# Patient Record
Sex: Female | Born: 1975 | Race: White | Hispanic: No | Marital: Single | Smoking: Former smoker
Health system: Southern US, Community
[De-identification: ages and names within clinical notes are randomized; demographics above are authoritative.]

## PROBLEM LIST (undated history)

## (undated) DIAGNOSIS — F17210 Nicotine dependence, cigarettes, uncomplicated: Secondary | ICD-10-CM

## (undated) DIAGNOSIS — A4902 Methicillin resistant Staphylococcus aureus infection, unspecified site: Secondary | ICD-10-CM

## (undated) DIAGNOSIS — F419 Anxiety disorder, unspecified: Secondary | ICD-10-CM

## (undated) DIAGNOSIS — R9431 Abnormal electrocardiogram [ECG] [EKG]: Secondary | ICD-10-CM

## (undated) DIAGNOSIS — R569 Unspecified convulsions: Secondary | ICD-10-CM

## (undated) DIAGNOSIS — E7800 Pure hypercholesterolemia, unspecified: Secondary | ICD-10-CM

## (undated) DIAGNOSIS — I459 Conduction disorder, unspecified: Secondary | ICD-10-CM

## (undated) DIAGNOSIS — I2510 Atherosclerotic heart disease of native coronary artery without angina pectoris: Secondary | ICD-10-CM

## (undated) DIAGNOSIS — J449 Chronic obstructive pulmonary disease, unspecified: Secondary | ICD-10-CM

## (undated) HISTORY — DX: Atherosclerotic heart disease of native coronary artery without angina pectoris: I25.10

## (undated) HISTORY — DX: Nicotine dependence, cigarettes, uncomplicated: F17.210

## (undated) HISTORY — PX: ACNE CYST REMOVAL: SUR1112

## (undated) HISTORY — DX: Conduction disorder, unspecified: I45.9

## (undated) HISTORY — DX: Abnormal electrocardiogram (ECG) (EKG): R94.31

## (undated) HISTORY — DX: Anxiety disorder, unspecified: F41.9

## (undated) HISTORY — DX: Pure hypercholesterolemia, unspecified: E78.00

---

## 2000-07-15 ENCOUNTER — Ambulatory Visit (HOSPITAL_BASED_OUTPATIENT_CLINIC_OR_DEPARTMENT_OTHER): Admission: RE | Admit: 2000-07-15 | Discharge: 2000-07-15 | Payer: Self-pay | Admitting: Orthopedic Surgery

## 2008-11-27 ENCOUNTER — Emergency Department (HOSPITAL_COMMUNITY): Admission: EM | Admit: 2008-11-27 | Discharge: 2008-11-27 | Payer: Self-pay | Admitting: Emergency Medicine

## 2008-12-03 ENCOUNTER — Emergency Department (HOSPITAL_COMMUNITY): Admission: EM | Admit: 2008-12-03 | Discharge: 2008-12-03 | Payer: Self-pay | Admitting: Emergency Medicine

## 2009-03-02 ENCOUNTER — Encounter: Payer: Self-pay | Admitting: Gastroenterology

## 2009-03-15 ENCOUNTER — Emergency Department (HOSPITAL_COMMUNITY): Admission: EM | Admit: 2009-03-15 | Discharge: 2009-03-15 | Payer: Self-pay | Admitting: Emergency Medicine

## 2009-11-28 ENCOUNTER — Emergency Department (HOSPITAL_COMMUNITY): Admission: EM | Admit: 2009-11-28 | Discharge: 2009-11-28 | Payer: Self-pay | Admitting: Emergency Medicine

## 2009-12-24 ENCOUNTER — Emergency Department (HOSPITAL_COMMUNITY): Admission: EM | Admit: 2009-12-24 | Discharge: 2009-12-24 | Payer: Self-pay | Admitting: Emergency Medicine

## 2010-05-01 ENCOUNTER — Emergency Department (HOSPITAL_COMMUNITY): Admission: EM | Admit: 2010-05-01 | Discharge: 2010-05-01 | Payer: Self-pay | Admitting: Emergency Medicine

## 2010-07-21 ENCOUNTER — Emergency Department (HOSPITAL_COMMUNITY): Admission: EM | Admit: 2010-07-21 | Discharge: 2010-07-22 | Payer: Self-pay | Admitting: Emergency Medicine

## 2011-01-02 LAB — CBC
MCH: 32.2 pg (ref 26.0–34.0)
MCV: 94.5 fL (ref 78.0–100.0)
RBC: 3.94 MIL/uL (ref 3.87–5.11)
RDW: 12.8 % (ref 11.5–15.5)

## 2011-01-02 LAB — POCT I-STAT, CHEM 8
Chloride: 108 mEq/L (ref 96–112)
Glucose, Bld: 100 mg/dL — ABNORMAL HIGH (ref 70–99)
HCT: 40 % (ref 36.0–46.0)
Potassium: 4.5 mEq/L (ref 3.5–5.1)

## 2011-01-02 LAB — DIFFERENTIAL
Basophils Relative: 1 % (ref 0–1)
Eosinophils Absolute: 0.1 10*3/uL (ref 0.0–0.7)
Neutrophils Relative %: 50 % (ref 43–77)

## 2011-01-02 LAB — URINALYSIS, ROUTINE W REFLEX MICROSCOPIC
Glucose, UA: NEGATIVE mg/dL
Ketones, ur: NEGATIVE mg/dL
Protein, ur: NEGATIVE mg/dL
Specific Gravity, Urine: 1.01 (ref 1.005–1.030)
pH: 6 (ref 5.0–8.0)

## 2011-01-02 LAB — URINE MICROSCOPIC-ADD ON

## 2011-01-05 LAB — CBC
Hemoglobin: 14 g/dL (ref 12.0–15.0)
MCH: 32.1 pg (ref 26.0–34.0)
MCHC: 34.7 g/dL (ref 30.0–36.0)
MCV: 92.6 fL (ref 78.0–100.0)
Platelets: 218 10*3/uL (ref 150–400)
RDW: 13.3 % (ref 11.5–15.5)
WBC: 9.5 10*3/uL (ref 4.0–10.5)

## 2011-01-05 LAB — COMPREHENSIVE METABOLIC PANEL
AST: 23 U/L (ref 0–37)
Albumin: 4.1 g/dL (ref 3.5–5.2)
BUN: 10 mg/dL (ref 6–23)
Calcium: 9.1 mg/dL (ref 8.4–10.5)
Creatinine, Ser: 1.08 mg/dL (ref 0.4–1.2)
GFR calc non Af Amer: 57 mL/min — ABNORMAL LOW (ref >60)

## 2011-01-05 LAB — DIFFERENTIAL
Basophils Absolute: 0 10*3/uL (ref 0.0–0.1)
Eosinophils Relative: 1 % (ref 0–5)
Lymphocytes Relative: 15 % (ref 12–46)
Lymphs Abs: 1.4 10*3/uL (ref 0.7–4.0)
Monocytes Absolute: 0.7 10*3/uL (ref 0.1–1.0)
Monocytes Relative: 8 % (ref 3–12)

## 2011-03-07 NOTE — Op Note (Signed)
[facility]  Patient:    Kendra Moreno                         MRN: [id number] Adm. Date:  [id number] Attending:  Marlowe Shores                           Operative Report  PREOPERATIVE DIAGNOSIS:  Ganglion cyst dorsally, right wrist.  POSTOPERATIVE DIAGNOSIS:  Ganglion cyst dorsally, right wrist.  PROCEDURE:  Excision of dorsal ganglion cyst, right wrist.  SURGEON:  Artist Pais. Mina Marble, M.D.  ASSISTANT:  Junius Roads. Ireton, P.A.C.  ANESTHESIA:  General.  TOURNIQUET TIME:  30 minutes.  COMPLICATIONS:  None.  DRAINS:  None.  SPECIMENS:  One specimen sent.  DESCRIPTION OF PROCEDURE:  The patient was taken to the operating room and after induction of adequate general anesthesia, the right upper extremity was prepped and draped in the usual sterile fashion.  An Esmarch was used to exsanguinate the limb, and the tourniquet was inflated to 350 mmHg.  At this time, a transverse incision 3.5-4 cm in length was made over a large mass over the second and third dorsal compartments in the right wrist.  The incision was taken down through the skin and subcutaneous tissues with care to carefully identify and retract the large dorsal veins and a branch of the radial sensory nerve.  At this point in time, the cyst was encountered.  It seemed to be coming between the second and third dorsal compartments.  It was carefully dissected free of the underlying soft tissues and traced down to its stalk overlying the dorsal capsule, and a small amount of capsule as well as the entire cyst and stalk were removed in its entirety.  The wound was then thoroughly irrigated.  Bipolar cautery was used to achieve hemostasis, and it was then closed with a running 3-0 Prolene subcuticular stitch.  Steri-Strips, 4 x 4s and fluffs and a volar splint were applied.  The patient tolerated the procedure well, went to recovery in stable fashion.  Also of note, 5 cc of 0.25%  Marcaine was injected in the wound postoperatively for pain control. DD:  07/15/00 TD:  07/15/00 Job: [id number]

## 2011-03-28 ENCOUNTER — Emergency Department (HOSPITAL_COMMUNITY): Payer: Medicaid Other

## 2011-03-28 ENCOUNTER — Emergency Department (HOSPITAL_COMMUNITY)
Admission: EM | Admit: 2011-03-28 | Discharge: 2011-03-28 | Disposition: A | Discharge status: Home / Self Care | Payer: Medicaid Other | Attending: Emergency Medicine | Admitting: Emergency Medicine

## 2011-03-28 DIAGNOSIS — G8929 Other chronic pain: Secondary | ICD-10-CM | POA: Insufficient documentation

## 2011-03-28 DIAGNOSIS — H8309 Labyrinthitis, unspecified ear: Secondary | ICD-10-CM | POA: Insufficient documentation

## 2011-03-28 DIAGNOSIS — H9319 Tinnitus, unspecified ear: Secondary | ICD-10-CM | POA: Insufficient documentation

## 2011-03-28 DIAGNOSIS — N390 Urinary tract infection, site not specified: Secondary | ICD-10-CM | POA: Insufficient documentation

## 2011-03-28 LAB — DIFFERENTIAL
Basophils Absolute: 0.1 10*3/uL (ref 0.0–0.1)
Basophils Relative: 1 % (ref 0–1)
Eosinophils Absolute: 0.1 10*3/uL (ref 0.0–0.7)
Eosinophils Relative: 2 % (ref 0–5)
Monocytes Relative: 7 % (ref 3–12)
Neutro Abs: 4.3 10*3/uL (ref 1.7–7.7)

## 2011-03-28 LAB — CBC
HCT: 40.8 % (ref 36.0–46.0)
Hemoglobin: 13.8 g/dL (ref 12.0–15.0)
MCHC: 33.8 g/dL (ref 30.0–36.0)
Platelets: 201 10*3/uL (ref 150–400)
RDW: 12.2 % (ref 11.5–15.5)

## 2011-03-28 LAB — BASIC METABOLIC PANEL
BUN: 17 mg/dL (ref 6–23)
Calcium: 9.3 mg/dL (ref 8.4–10.5)
Chloride: 104 mEq/L (ref 96–112)
Sodium: 140 mEq/L (ref 135–145)

## 2011-03-28 LAB — URINE MICROSCOPIC-ADD ON

## 2011-03-28 LAB — URINALYSIS, ROUTINE W REFLEX MICROSCOPIC
Glucose, UA: NEGATIVE mg/dL
Ketones, ur: NEGATIVE mg/dL
Nitrite: NEGATIVE
Urobilinogen, UA: 0.2 mg/dL (ref 0.0–1.0)

## 2011-03-28 LAB — POCT PREGNANCY, URINE: Preg Test, Ur: NEGATIVE

## 2011-03-29 LAB — TSH: TSH: 1.176 u[IU]/mL (ref 0.350–4.500)

## 2011-07-27 ENCOUNTER — Emergency Department (HOSPITAL_COMMUNITY)
Admission: EM | Admit: 2011-07-27 | Discharge: 2011-07-27 | Disposition: A | Discharge status: Other Acute Inpatient Hospital | Payer: Medicaid Other | Source: Home / Self Care | Attending: Emergency Medicine | Admitting: Emergency Medicine

## 2011-07-27 ENCOUNTER — Emergency Department (HOSPITAL_COMMUNITY): Payer: Medicaid Other

## 2011-07-27 ENCOUNTER — Encounter: Payer: Self-pay | Admitting: Emergency Medicine

## 2011-07-27 ENCOUNTER — Emergency Department (HOSPITAL_COMMUNITY)
Admission: EM | Admit: 2011-07-27 | Discharge: 2011-07-28 | Disposition: A | Discharge status: Home / Self Care | Payer: Medicaid Other | Attending: Emergency Medicine | Admitting: Emergency Medicine

## 2011-07-27 DIAGNOSIS — D72829 Elevated white blood cell count, unspecified: Secondary | ICD-10-CM | POA: Insufficient documentation

## 2011-07-27 DIAGNOSIS — K8020 Calculus of gallbladder without cholecystitis without obstruction: Secondary | ICD-10-CM | POA: Insufficient documentation

## 2011-07-27 DIAGNOSIS — Z79899 Other long term (current) drug therapy: Secondary | ICD-10-CM | POA: Insufficient documentation

## 2011-07-27 DIAGNOSIS — O99891 Other specified diseases and conditions complicating pregnancy: Secondary | ICD-10-CM | POA: Insufficient documentation

## 2011-07-27 DIAGNOSIS — R1084 Generalized abdominal pain: Secondary | ICD-10-CM | POA: Insufficient documentation

## 2011-07-27 DIAGNOSIS — R1011 Right upper quadrant pain: Secondary | ICD-10-CM | POA: Insufficient documentation

## 2011-07-27 DIAGNOSIS — N12 Tubulo-interstitial nephritis, not specified as acute or chronic: Secondary | ICD-10-CM | POA: Insufficient documentation

## 2011-07-27 DIAGNOSIS — R509 Fever, unspecified: Secondary | ICD-10-CM | POA: Insufficient documentation

## 2011-07-27 DIAGNOSIS — O2390 Unspecified genitourinary tract infection in pregnancy, unspecified trimester: Secondary | ICD-10-CM | POA: Insufficient documentation

## 2011-07-27 DIAGNOSIS — Z87891 Personal history of nicotine dependence: Secondary | ICD-10-CM | POA: Insufficient documentation

## 2011-07-27 DIAGNOSIS — E876 Hypokalemia: Secondary | ICD-10-CM | POA: Insufficient documentation

## 2011-07-27 HISTORY — DX: Chronic obstructive pulmonary disease, unspecified: J44.9

## 2011-07-27 HISTORY — DX: Unspecified convulsions: R56.9

## 2011-07-27 LAB — COMPREHENSIVE METABOLIC PANEL
ALT: 13 U/L (ref 0–35)
AST: 14 U/L (ref 0–37)
Albumin: 3.1 g/dL — ABNORMAL LOW (ref 3.5–5.2)
Alkaline Phosphatase: 61 U/L (ref 39–117)
GFR calc Af Amer: >90 mL/min (ref >90)
Glucose, Bld: 115 mg/dL — ABNORMAL HIGH (ref 70–99)
Potassium: 2.8 mEq/L — ABNORMAL LOW (ref 3.5–5.1)
Sodium: 130 mEq/L — ABNORMAL LOW (ref 135–145)
Total Protein: 6.2 g/dL (ref 6.0–8.3)

## 2011-07-27 LAB — CBC
MCH: 32.1 pg (ref 26.0–34.0)
Platelets: 158 10*3/uL (ref 150–400)
RBC: 3.71 MIL/uL — ABNORMAL LOW (ref 3.87–5.11)
WBC: 11.6 10*3/uL — ABNORMAL HIGH (ref 4.0–10.5)

## 2011-07-27 LAB — DIFFERENTIAL
Eosinophils Absolute: 0 10*3/uL (ref 0.0–0.7)
Lymphs Abs: 0.7 10*3/uL (ref 0.7–4.0)
Neutrophils Relative %: 88 % — ABNORMAL HIGH (ref 43–77)

## 2011-07-27 LAB — URINALYSIS, ROUTINE W REFLEX MICROSCOPIC
Bilirubin Urine: NEGATIVE
Glucose, UA: NEGATIVE mg/dL
Specific Gravity, Urine: <1.005 — ABNORMAL LOW (ref 1.005–1.030)
pH: 6 (ref 5.0–8.0)

## 2011-07-27 LAB — URINE MICROSCOPIC-ADD ON

## 2011-07-27 MED ORDER — HYDROMORPHONE HCL 1 MG/ML IJ SOLN
0.5000 mg | Freq: Once | INTRAMUSCULAR | Status: AC
  Administered 2011-07-27: 0.5 mg via INTRAVENOUS

## 2011-07-27 MED ORDER — ACETAMINOPHEN 500 MG PO TABS
ORAL_TABLET | ORAL | Status: AC
  Administered 2011-07-27: 1000 mg via ORAL
  Filled 2011-07-27: qty 2

## 2011-07-27 MED ORDER — SODIUM CHLORIDE 0.9 % IV SOLN: 999.0000 mL | Freq: Once | INTRAVENOUS | Status: DC

## 2011-07-27 MED ORDER — ACETAMINOPHEN 500 MG PO TABS
1000.0000 mg | ORAL_TABLET | Freq: Once | ORAL | Status: AC
  Administered 2011-07-27: 1000 mg via ORAL

## 2011-07-27 MED ORDER — ONDANSETRON HCL 4 MG/2ML IJ SOLN
4.0000 mg | Freq: Once | INTRAMUSCULAR | Status: AC
  Administered 2011-07-27: 4 mg via INTRAVENOUS
  Filled 2011-07-27: qty 2

## 2011-07-27 MED ORDER — HYDROMORPHONE HCL 1 MG/ML IJ SOLN
INTRAMUSCULAR | Status: AC
  Administered 2011-07-27: 0.5 mg via INTRAVENOUS
  Filled 2011-07-27: qty 1

## 2011-07-27 NOTE — ED Notes (Signed)
Patient c/o fevers and right flank pain since Thursday. Patient unsure of temp-does not have a thermometer. Patient reports chills, coughing, right ear pain, nausea, and vomiting. Patient 4 months pregnant. Reports taking 2 ibuprofen yesterday.

## 2011-07-27 NOTE — ED Notes (Signed)
Pt with [facility]. Transportation in route to [facility].

## 2011-07-27 NOTE — ED Notes (Signed)
Report to [facility] given. NAD at this time.

## 2011-07-27 NOTE — ED Provider Notes (Signed)
History     CSN: [id number] Arrival date & time: 07/27/2011 12:39 PM  Chief Complaint  Patient presents with  . Flank Pain  . Fever    (Consider location/radiation/quality/duration/timing/severity/associated sxs/prior treatment) HPI The patient presents with 3 days of generalized discomfort. Her symptoms began gradually, and since onset she has had subjective fever, nausea with emesis, diarrhea, and decreased urine output. Over this timeframe she is also developed right upper quadrant/right flank pain. Minimal relief with ibuprofen, pain seems to be worse when trying to initiate a urinary stream. The patient is 4 months pregnant, and notes this pregnancy is "normal", she had an ultrasound that was unremarkable. She denies any new vaginal discharge, or bleeding. Past Medical History  Diagnosis Date  . COPD (chronic obstructive pulmonary disease)   . Seizures     Past Surgical History  Procedure Date  . Cesarean section   . Acne cyst removal     Family History  Problem Relation Age of Onset  . Diabetes Mother   . COPD Father   . Asthma Sister   . COPD Sister   . Cancer Other     History  Substance Use Topics  . Smoking status: Former Smoker -- 0.3 packs/day for 10 years    Types: Cigarettes    Quit date: 07/23/2011  . Smokeless tobacco: Not on file  . Alcohol Use: No    OB History    Grav Para Term Preterm Abortions TAB SAB Ect Mult Living   2 1 1       1       Review of Systems Gen: Per HPI HEENT: No HA CV: No CP Resp: No dyspnea Abd: Per HPI, otherwise negative Musk: Per HPI, otherwise negative Neuro: No dysesthesia, or focal changes GU: Per HPI, otherwise negative Skin: Neg Psych: Neg  Allergies  Penicillins; Sulfa antibiotics; Other; and Apple  Home Medications   Current Outpatient Rx  Name Route Sig Dispense Refill  . IBUPROFEN 200 MG PO TABS Oral Take 400 mg by mouth every 6 (six) hours as needed. For fever. Is aware not to take during third  trimester of pregnancy.       BP 104/53  Pulse 102  Temp(Src) 100.3 F (37.9 C) (Oral)  Resp 18  Ht 5\' 7"  (1.702 m)  Wt 133 lb 12.8 oz (60.691 kg)  BMI 20.96 kg/m2  SpO2 100%  Physical Exam  Constitutional: She is oriented to person, place, and time. She appears well-developed and well-nourished.  HENT:  Head: Normocephalic and atraumatic.  Eyes: EOM are normal.  Cardiovascular: Normal rate and regular rhythm.   Pulmonary/Chest: Effort normal and breath sounds normal.  Abdominal: Soft. Normal appearance and bowel sounds are normal. She exhibits no distension and no fluid wave. There is no hepatosplenomegaly. There is tenderness in the right upper quadrant. There is positive Murphy's sign. There is no rebound and no CVA tenderness.  Musculoskeletal: She exhibits no edema and no tenderness.  Neurological: She is alert and oriented to person, place, and time.  Skin: Skin is warm and dry.    ED Course  Procedures (including critical care time)  Labs Reviewed  CBC - Abnormal; Notable for the following:    WBC 11.6 (*)    RBC 3.71 (*)    Hemoglobin 11.9 (*)    HCT 33.8 (*)    All other components within normal limits  DIFFERENTIAL - Abnormal; Notable for the following:    Neutrophils Relative 88 (*)    Neutro  Abs 10.2 (*)    Lymphocytes Relative 6 (*)    All other components within normal limits  COMPREHENSIVE METABOLIC PANEL  LIPASE, BLOOD  URINALYSIS, ROUTINE W REFLEX MICROSCOPIC   No results found.   No diagnosis found.    MDM  G2 P1 female presents with 3 days of generalized discomfort, and right upper quadrant pain with worsening anorexia, nausea, diarrhea. I exam patient has a positive Murphy sign. There is concern for acute cholecystitis.  Labs notable for mild leukocytosis.  The patient subsequently became more febrile (T max 102) w increasing RUQ pain.  She received analgesia, antipyretics and was transferred to [facility] for [place] RUQ, which is not available at [facility] on  weekend afternoons.       Gerhard Munch, MD 07/27/11 2153

## 2011-07-28 ENCOUNTER — Emergency Department (HOSPITAL_COMMUNITY): Payer: Medicaid Other

## 2012-10-05 IMAGING — CT CT HEAD W/O CM
1 series · 16 of 30 positions shown, 20 images · non-contrast
Comparison: None.

CLINICAL DATA: Dizziness

CT HEAD WITHOUT CONTRAST
TECHNIQUE: Contiguous axial images were obtained from the base of
the skull through the vertex without contrast.

[Series 2: headseq 4.8 h37s · axial · 0.43mm/px · z∈[+103,+233]mm · 16 of 30 slices shown, 20 images]
[im 2/30  brain]
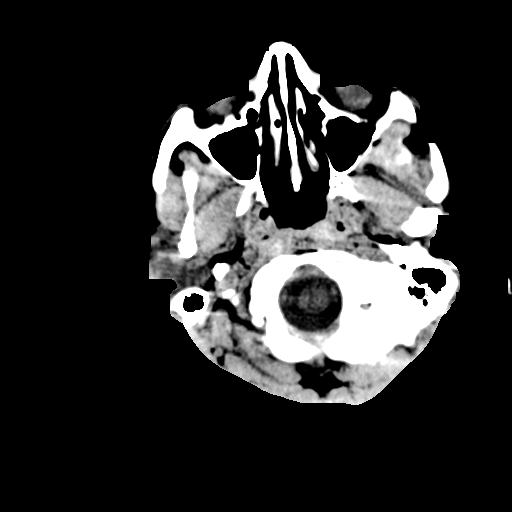
[im 2/30  bone]
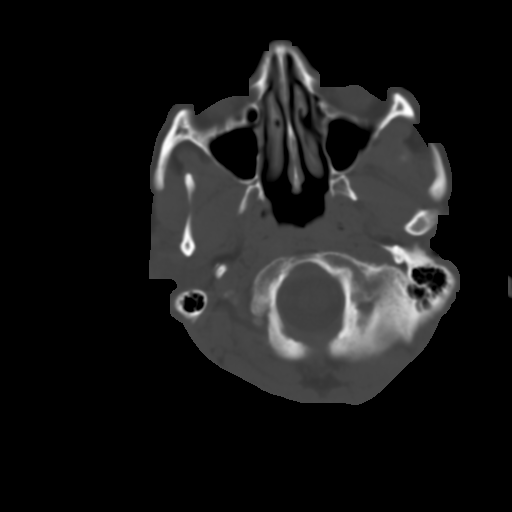
[im 4/30  brain]
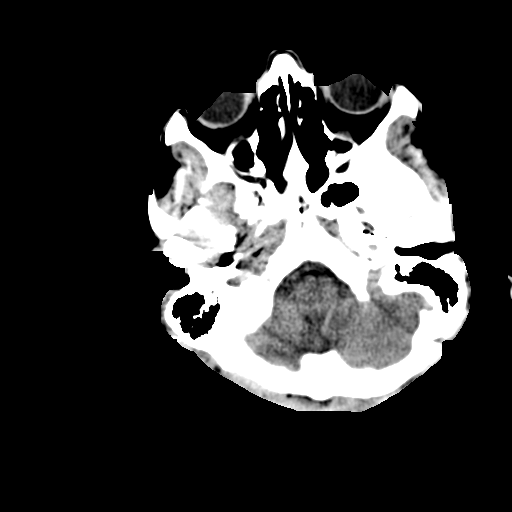
[im 6/30  brain]
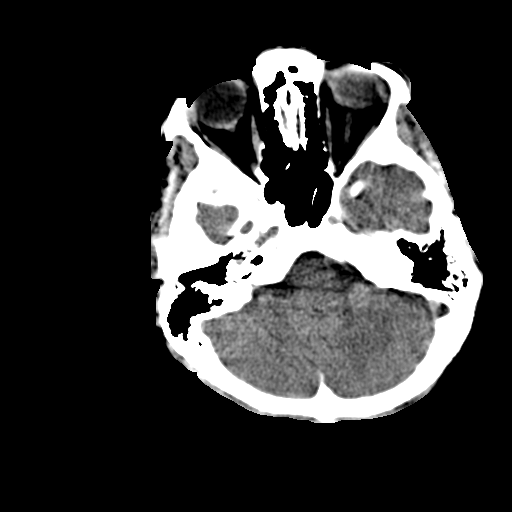
[im 8/30  brain]
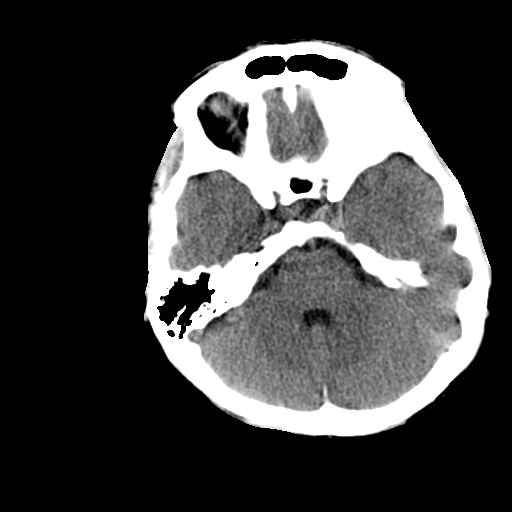
[im 9/30  brain]
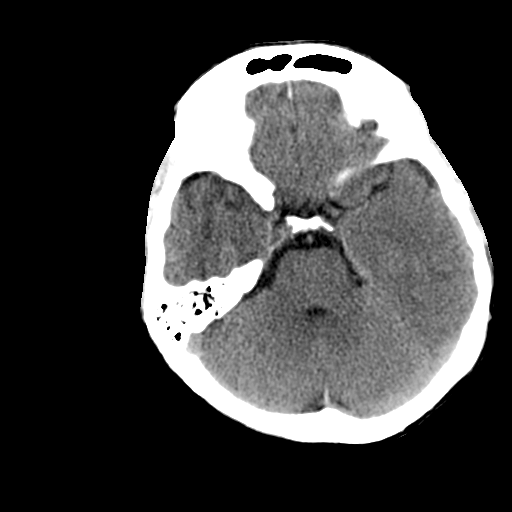
[im 9/30  bone]
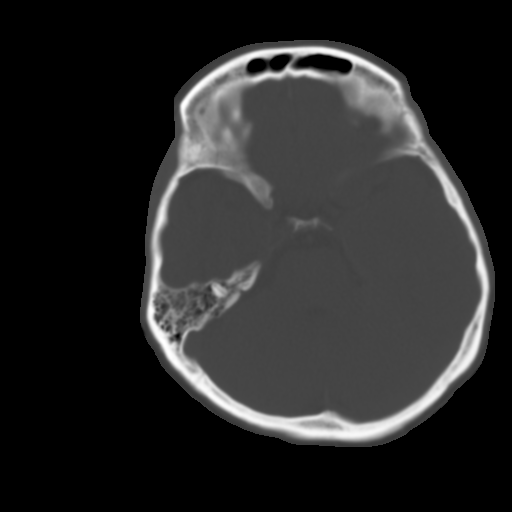
[im 11/30  brain]
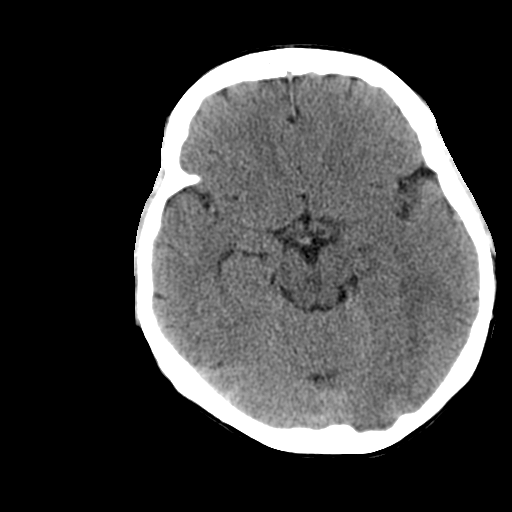
[im 13/30  brain]
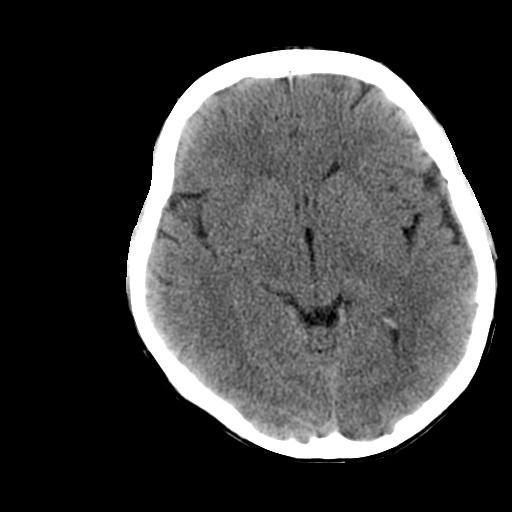
[im 15/30  brain]
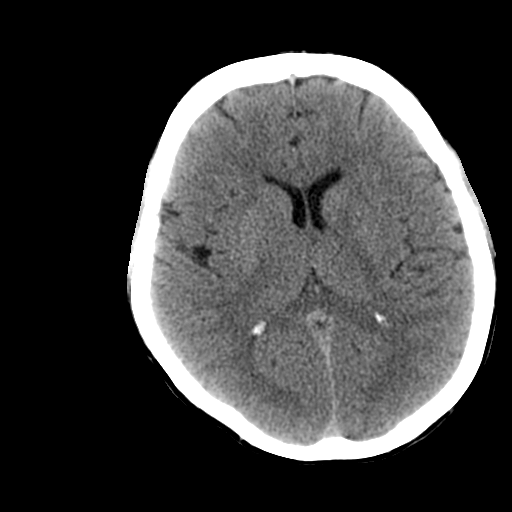
[im 16/30  brain]
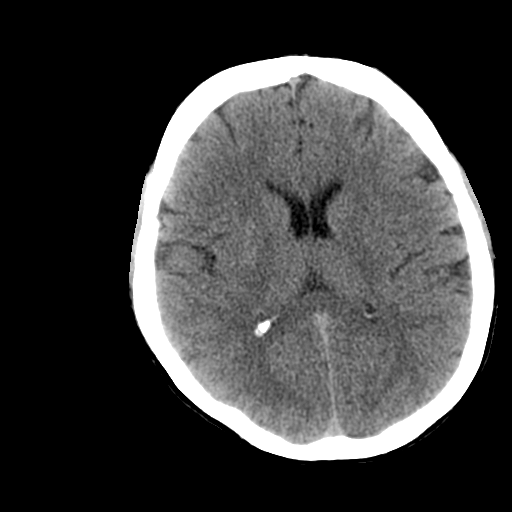
[im 16/30  bone]
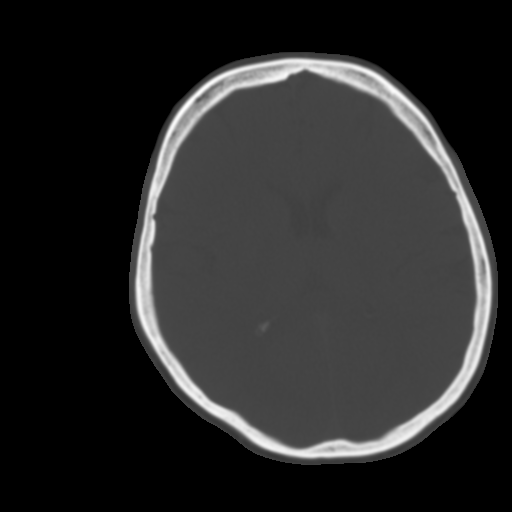
[im 18/30  brain]
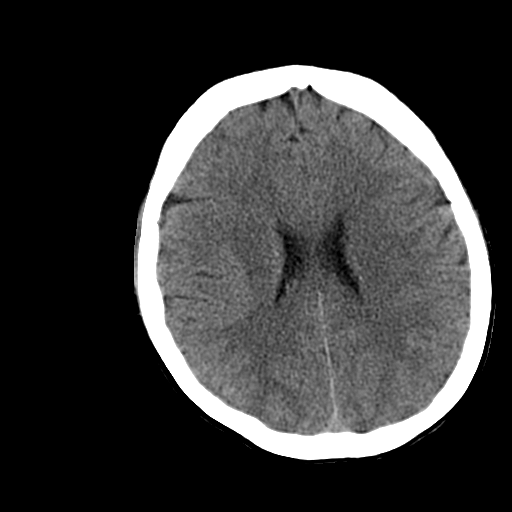
[im 20/30  brain]
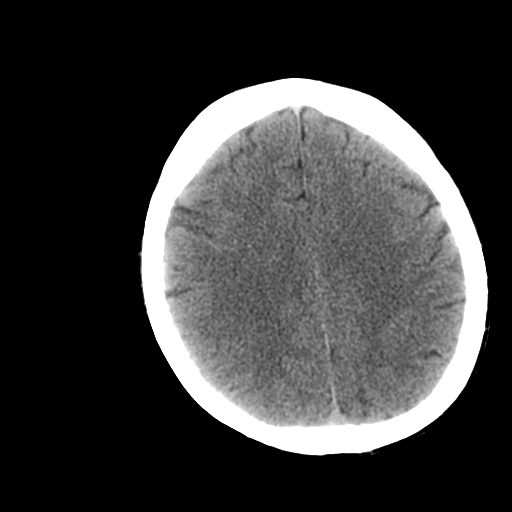
[im 22/30  brain]
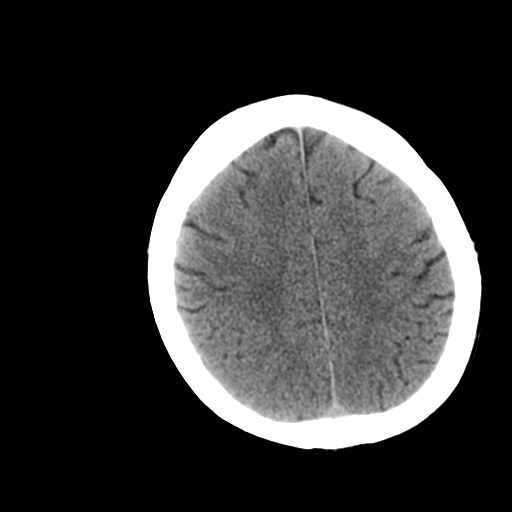
[im 23/30  brain]
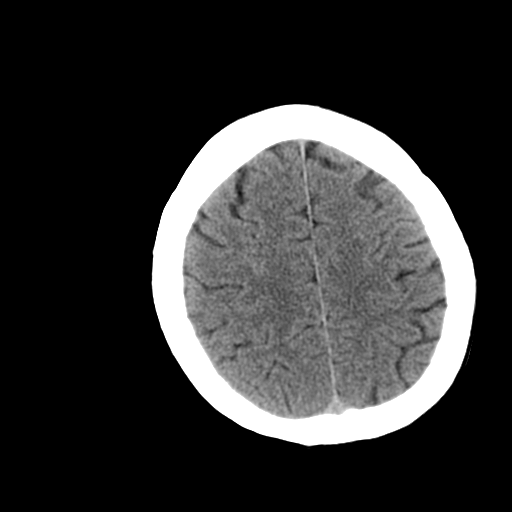
[im 23/30  bone]
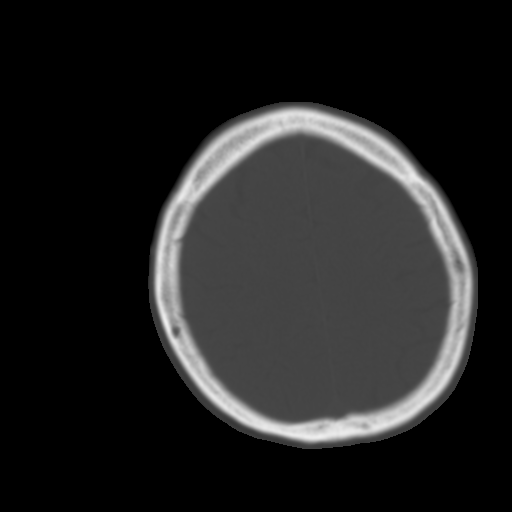
[im 25/30  brain]
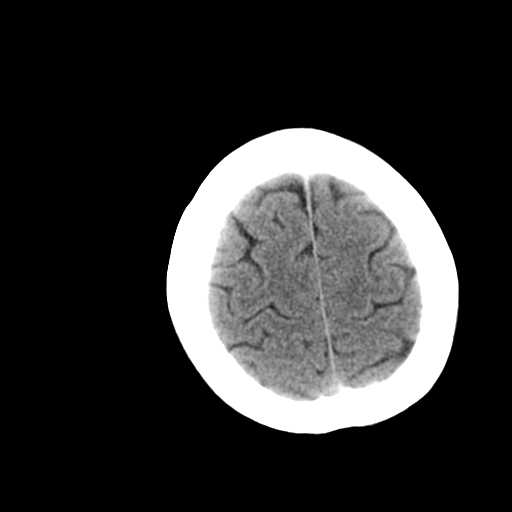
[im 27/30  brain]
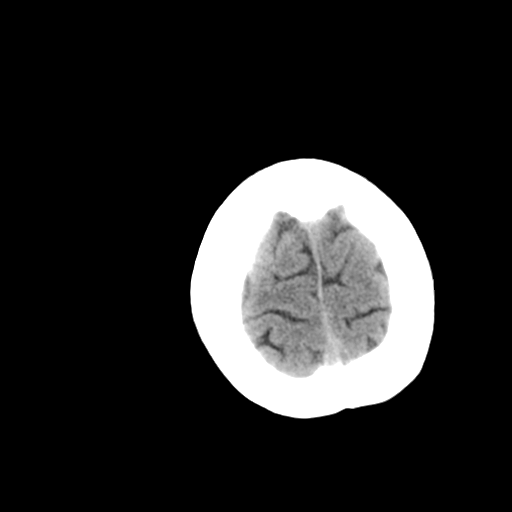
[im 29/30  brain]
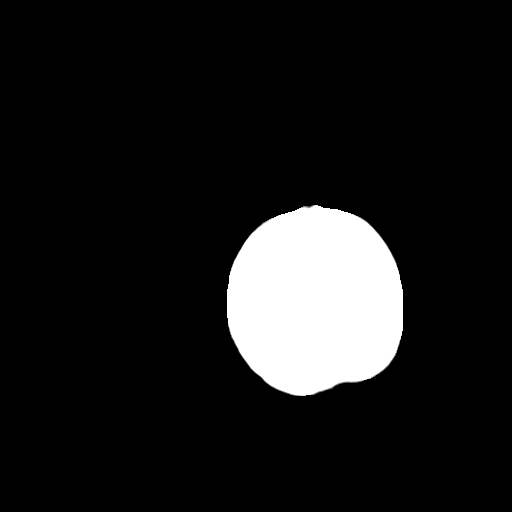

[16 of 30 positions shown; findings below may reference images not displayed]

FINDINGS: Ventricular size and CSF spaces normal.

 No evidence for acute infarct, hemorrhage, or mass lesion. No
extra-axial fluid collections or midline shift.  Calvarium intact.
No fluid in the sinuses visualized.
IMPRESSION: No acute or significant findings.

## 2014-06-05 ENCOUNTER — Other Ambulatory Visit: Payer: Self-pay | Admitting: *Deleted

## 2014-08-21 ENCOUNTER — Encounter: Payer: Self-pay | Admitting: Emergency Medicine

## 2017-10-25 ENCOUNTER — Encounter (HOSPITAL_COMMUNITY): Payer: Self-pay | Admitting: Emergency Medicine

## 2017-10-25 ENCOUNTER — Other Ambulatory Visit: Payer: Self-pay

## 2017-10-25 ENCOUNTER — Emergency Department (HOSPITAL_COMMUNITY): Payer: Medicaid Other

## 2017-10-25 ENCOUNTER — Emergency Department (HOSPITAL_COMMUNITY)
Admission: EM | Admit: 2017-10-25 | Discharge: 2017-10-25 | Disposition: A | Discharge status: Home / Self Care | Payer: Medicaid Other | Attending: Emergency Medicine | Admitting: Emergency Medicine

## 2017-10-25 DIAGNOSIS — Z87891 Personal history of nicotine dependence: Secondary | ICD-10-CM | POA: Diagnosis not present

## 2017-10-25 DIAGNOSIS — R05 Cough: Secondary | ICD-10-CM | POA: Diagnosis present

## 2017-10-25 DIAGNOSIS — J449 Chronic obstructive pulmonary disease, unspecified: Secondary | ICD-10-CM | POA: Diagnosis not present

## 2017-10-25 DIAGNOSIS — J209 Acute bronchitis, unspecified: Secondary | ICD-10-CM

## 2017-10-25 MED ORDER — DOXYCYCLINE HYCLATE 100 MG PO CAPS: 100.0000 mg | ORAL_CAPSULE | Freq: Two times a day (BID) | ORAL | 0 refills | Status: DC

## 2017-10-25 MED ORDER — IBUPROFEN 400 MG PO TABS: 400.0000 mg | ORAL_TABLET | Freq: Four times a day (QID) | ORAL | 0 refills | Status: DC

## 2017-10-25 MED ORDER — HYDROCODONE-HOMATROPINE 5-1.5 MG/5ML PO SYRP: 5.0000 mL | ORAL_SOLUTION | Freq: Four times a day (QID) | ORAL | 0 refills | Status: DC | PRN

## 2017-10-25 MED ORDER — PROMETHAZINE HCL 12.5 MG PO TABS
12.5000 mg | ORAL_TABLET | Freq: Once | ORAL | Status: AC
  Administered 2017-10-25: 12.5 mg via ORAL
  Filled 2017-10-25: qty 1

## 2017-10-25 MED ORDER — DOXYCYCLINE HYCLATE 100 MG PO TABS
100.0000 mg | ORAL_TABLET | Freq: Once | ORAL | Status: AC
  Administered 2017-10-25: 100 mg via ORAL
  Filled 2017-10-25: qty 1

## 2017-10-25 MED ORDER — CYCLOBENZAPRINE HCL 10 MG PO TABS
10.0000 mg | ORAL_TABLET | Freq: Once | ORAL | Status: AC
  Administered 2017-10-25: 10 mg via ORAL
  Filled 2017-10-25: qty 1

## 2017-10-25 MED ORDER — IBUPROFEN 400 MG PO TABS
400.0000 mg | ORAL_TABLET | Freq: Once | ORAL | Status: AC
  Administered 2017-10-25: 400 mg via ORAL
  Filled 2017-10-25: qty 1

## 2017-10-25 NOTE — ED Notes (Signed)
EDP at bedside  

## 2017-10-25 NOTE — Discharge Instructions (Signed)
Your oxygen level is 98% on room air.  Your temperature is within normal limits.  Your chest x-ray shows bronchitis, and an area of your lung that you are not completely expanding.  The air are scarring areas probably related to your smoking.  Please use albuterol 2 puffs every 4 hours.  Please increase water, Gatorade, etc.  Decrease caffeinated beverages.  Use doxycycline 2 times daily with food.  Use Hycodan for cough.This medication may cause drowsiness. Please do not drink, drive, or participate in activity that requires concentration while taking this medication.  Use ibuprofen with breakfast, lunch, dinner, and at bedtime for inflammation and pain.  Please see your Medicaid access physician or return to the emergency department if any emergent changes, problems, or concerns.

## 2017-10-25 NOTE — ED Triage Notes (Signed)
PT c/o productive thick yellow/greenish sputum with no fever over the past 10 days. PT states she has been to [facility] ED x2 this complaint this week and had a CT scan of her lungs but they didn't perscribe an antibiotic. PT was given prednisone, hycodan syrup, norco and told to follow up with primary doctor. PT c/o right sided rib pain today.

## 2017-10-25 NOTE — ED Provider Notes (Signed)
[facility] EMERGENCY DEPARTMENT Provider Note   CSN: [id number] Arrival date & time: 10/25/17  1338     History   Chief Complaint Chief Complaint  Patient presents with  . Cough    HPI Kendra Moreno is a 42 y.o. female.  Patient is a 42 year old female who presents to the emergency department with a complaint of cough and right-sided chest pain.  The patient states that she has been bothered with a productive cough over the last 10 days.  She stated that it started out clear but now it is a yellow-green mucus and sputum.  She has been doing so much coughing that she has pain in her right rib area just under the breast.  She states that the pain is getting progressively worse.  She feels that the cough is getting progressively worse.  The patient was given prednisone Hycodan and albuterol at another emergency department.  She states that the cough medication helped for a little while but when she ran out the pain came back.  Patient also states that she had a CT scan of her chest and she was told that she had some chronic changes, but no other problems appreciated.  She presents to this emergency department because the cough seems to be getting worse and she is concerned about the production of so much mucus especially discolored mucus.  No reported fever, no chills, no hemoptysis reported.  No previous operations or procedures involving the chest.  It is of note the patient is a smoker.      Past Medical History:  Diagnosis Date  . COPD (chronic obstructive pulmonary disease) (HCC)   . Seizures (HCC)     There are no active problems to display for this patient.   Past Surgical History:  Procedure Laterality Date  . ACNE CYST REMOVAL    . CESAREAN SECTION      OB History    Gravida Para Term Preterm AB Living   2 1 1     1    SAB TAB Ectopic Multiple Live Births                   Home Medications    Prior to Admission medications   Medication Sig Start Date End  Date Taking? Authorizing Provider  ibuprofen (ADVIL,MOTRIN) 200 MG tablet Take 400 mg by mouth every 6 (six) hours as needed. For fever. Is aware not to take during third trimester of pregnancy.     [provider]    Family History Family History  Problem Relation Age of Onset  . Diabetes Mother   . COPD Father   . Asthma Sister   . COPD Sister   . Cancer Other     Social History Social History   Tobacco Use  . Smoking status: Former Smoker    Packs/day: 0.30    Years: 10.00    Pack years: 3.00    Types: Cigarettes    Last attempt to quit: 07/23/2011    Years since quitting: 6.2  . Smokeless tobacco: Never Used  Substance Use Topics  . Alcohol use: No  . Drug use: No     Allergies   Penicillins; Sulfa antibiotics; Other; and Apple   Review of Systems Review of Systems  Constitutional: Negative for activity change.       All ROS Neg except as noted in HPI  HENT: Negative for nosebleeds.   Eyes: Negative for photophobia and discharge.  Respiratory: Positive for cough.  Negative for shortness of breath and wheezing.        Chest wall pain  Cardiovascular: Negative for chest pain and palpitations.  Gastrointestinal: Negative for abdominal pain and blood in stool.  Genitourinary: Negative for dysuria, frequency and hematuria.  Musculoskeletal: Negative for arthralgias, back pain and neck pain.  Skin: Negative.   Neurological: Negative for dizziness, seizures and speech difficulty.  Psychiatric/Behavioral: Negative for confusion and hallucinations.     Physical Exam Updated Vital Signs BP (!) 113/94 (BP Location: Right Arm)   Pulse 88   Temp 98 F (36.7 C) (Oral)   Resp 20   Ht 5\' 6"  (1.676 m)   Wt 52.2 kg (115 lb)   SpO2 98%   Breastfeeding? Unknown   BMI 18.56 kg/m   Physical Exam  Constitutional: She is oriented to person, place, and time. She appears well-developed and well-nourished.  Non-toxic appearance.  HENT:  Head: Normocephalic.    Right Ear: Tympanic membrane and external ear normal.  Left Ear: Tympanic membrane and external ear normal.  Mild nasal congestion noted.  Question over the sinuses.  Eyes: EOM and lids are normal. Pupils are equal, round, and reactive to light.  Neck: Normal range of motion. Neck supple. Carotid bruit is not present.  Cardiovascular: Normal rate, regular rhythm, normal heart sounds, intact distal pulses and normal pulses.  Pulmonary/Chest: Breath sounds normal. No respiratory distress. She exhibits tenderness.  There is symmetrical rise and fall of the chest noted.  Patient speaks in complete sentences.  Coarse breath sounds present, but no wheezes or stridor appreciated.    Abdominal: Soft. Bowel sounds are normal. There is no tenderness. There is no guarding.  Musculoskeletal: Normal range of motion.  Lymphadenopathy:       Head (right side): No submandibular adenopathy present.       Head (left side): No submandibular adenopathy present.    She has no cervical adenopathy.  Neurological: She is alert and oriented to person, place, and time. She has normal strength. No cranial nerve deficit or sensory deficit.  Skin: Skin is warm and dry.  Psychiatric: She has a normal mood and affect. Her speech is normal.  Nursing note and vitals reviewed.    ED Treatments / Results  Labs (all labs ordered are listed, but only abnormal results are displayed) Labs Reviewed - No data to display  EKG  EKG Interpretation None       Radiology Dg Chest 2 View  Result Date: 10/25/2017 CLINICAL DATA:  Cough for 10 days. EXAM: CHEST  2 VIEW COMPARISON:  10/22/2017 chest CT and prior studies FINDINGS: The cardiomediastinal silhouette is unremarkable. Subsegmental lingular atelectasis again noted. There is no evidence of focal airspace disease, pulmonary edema, suspicious pulmonary nodule/mass, pleural effusion, or pneumothorax. No acute bony abnormalities are identified. IMPRESSION: Unchanged  subsegmental lingular atelectasis. No evidence of focal pneumonia. Electronically Signed   By: Harmon PierJeffrey  Hu M.D.   On: 10/25/2017 15:51    Procedures Procedures (including critical care time)  Medications Ordered in ED Medications  cyclobenzaprine (FLEXERIL) tablet 10 mg (not administered)  ibuprofen (ADVIL,MOTRIN) tablet 400 mg (not administered)  doxycycline (VIBRA-TABS) tablet 100 mg (not administered)  promethazine (PHENERGAN) tablet 12.5 mg (not administered)     Initial Impression / Assessment and Plan / ED Course  I have reviewed the triage vital signs and the nursing notes.  Pertinent labs & imaging results that were available during my care of the patient were reviewed by me and considered in  my medical decision making (see chart for details).       Final Clinical Impressions(s) / ED Diagnoses MDM Vital signs within normal limits.  Pulse oximetry is 96% on room air, within normal limits by my interpretation.  Patient has been battling with cough and congestion for nearly 2 weeks.  She has been seen by another emergency department where she had a workup, but states that after the cough medicine ran out she feels as though she is getting worse.  She has only a small amount of the prednisone taper before it to runs out.  She states that nearly every cough she has productive phlegm and it is mostly yellow to green in color.  Patient has symmetrical rise and fall of the chest.  She speaks in complete sentences without problem.  She has some pain of her right anterior chest, but in no distress. Chest x-ray was compared to a CT scan of the chest on January 3.  There is unchanged subsegmental lingular atelectasis present, no focal pneumonia, no mass appreciated and no pneumothorax noted.  I have asked the patient to continue the albuterol every 4 hours.  I have asked her to stop smoking.  I have also asked her to increase fluids.  Will add doxycycline, ibuprofen, and Hycodan to current  medication.  Patient is to follow-up with her Medicaid access physician or return to the emergency department if any changes, problems, or concerns.   Final diagnoses:  Acute bronchitis, unspecified organism    ED Discharge Orders        Ordered    doxycycline (VIBRAMYCIN) 100 MG capsule  2 times daily     10/25/17 1625    HYDROcodone-homatropine (HYCODAN) 5-1.5 MG/5ML syrup  Every 6 hours PRN     10/25/17 1625    ibuprofen (ADVIL,MOTRIN) 400 MG tablet  4 times daily     10/25/17 1625       Ivery Quale, PA-C 10/25/17 1647    Vanetta Mulders, MD 10/26/17 [id number]

## 2019-08-03 ENCOUNTER — Encounter (HOSPITAL_COMMUNITY): Payer: Self-pay | Admitting: *Deleted

## 2019-08-03 ENCOUNTER — Other Ambulatory Visit: Payer: Self-pay

## 2019-08-03 DIAGNOSIS — Y999 Unspecified external cause status: Secondary | ICD-10-CM | POA: Insufficient documentation

## 2019-08-03 DIAGNOSIS — F17210 Nicotine dependence, cigarettes, uncomplicated: Secondary | ICD-10-CM | POA: Insufficient documentation

## 2019-08-03 DIAGNOSIS — J449 Chronic obstructive pulmonary disease, unspecified: Secondary | ICD-10-CM | POA: Insufficient documentation

## 2019-08-03 DIAGNOSIS — Y9389 Activity, other specified: Secondary | ICD-10-CM | POA: Insufficient documentation

## 2019-08-03 DIAGNOSIS — S638X2A Sprain of other part of left wrist and hand, initial encounter: Secondary | ICD-10-CM | POA: Insufficient documentation

## 2019-08-03 DIAGNOSIS — Y929 Unspecified place or not applicable: Secondary | ICD-10-CM | POA: Insufficient documentation

## 2019-08-03 DIAGNOSIS — W010XXA Fall on same level from slipping, tripping and stumbling without subsequent striking against object, initial encounter: Secondary | ICD-10-CM | POA: Insufficient documentation

## 2019-08-03 NOTE — ED Triage Notes (Signed)
Pt states she fell and tried to catch herself landing on her left wrist;

## 2019-08-04 ENCOUNTER — Emergency Department (HOSPITAL_COMMUNITY): Payer: Self-pay

## 2019-08-04 ENCOUNTER — Emergency Department (HOSPITAL_COMMUNITY)
Admission: EM | Admit: 2019-08-04 | Discharge: 2019-08-04 | Disposition: A | Discharge status: Home / Self Care | Payer: Self-pay | Attending: Emergency Medicine | Admitting: Emergency Medicine

## 2019-08-04 DIAGNOSIS — S63502A Unspecified sprain of left wrist, initial encounter: Secondary | ICD-10-CM

## 2019-08-04 MED ORDER — HYDROCODONE-ACETAMINOPHEN 5-325 MG PO TABS
1.0000 | ORAL_TABLET | Freq: Once | ORAL | Status: AC
  Administered 2019-08-04: 1 via ORAL
  Filled 2019-08-04: qty 1

## 2019-08-04 NOTE — ED Provider Notes (Addendum)
[facility] EMERGENCY DEPARTMENT Provider Note   CSN: [id number] Arrival date & time: 08/03/19  2337     History   Chief Complaint Chief Complaint  Patient presents with  . Fall  . Wrist Pain    HPI Kendra Moreno is a 43 y.o. female.     The history is provided by the patient.  Fall This is a new problem. The current episode started 6 to 12 hours ago. The problem occurs constantly. Pertinent negatives include no headaches. Nothing aggravates the symptoms. Nothing relieves the symptoms.  Wrist Pain Pertinent negatives include no headaches.  Patient reports she fell several hours ago while moving a mattress.  She fell and landed on her left wrist No head injury.  No LOC.  Reports pain in left wrist but no other acute complaints Past Medical History:  Diagnosis Date  . COPD (chronic obstructive pulmonary disease) ([place])   . Seizures ([place])     There are no active problems to display for this patient.   Past Surgical History:  Procedure Laterality Date  . ACNE CYST REMOVAL    . CESAREAN SECTION       OB History    Gravida  2   Para  1   Term  1   Preterm      AB      Living  1     SAB      TAB      Ectopic      Multiple      Live Births               Home Medications    Prior to Admission medications   Medication Sig Start Date End Date Taking? Authorizing Provider  doxycycline (VIBRAMYCIN) 100 MG capsule Take 1 capsule (100 mg total) by mouth 2 (two) times daily. 10/25/17   Lily Kocher, PA-C  HYDROcodone-homatropine [facility]) 5-1.5 MG/5ML syrup Take 5 mLs by mouth every 6 (six) hours as needed. 10/25/17   Lily Kocher, PA-C  ibuprofen (ADVIL,MOTRIN) 400 MG tablet Take 1 tablet (400 mg total) by mouth 4 (four) times daily. 10/25/17   Lily Kocher, PA-C    Family History Family History  Problem Relation Age of Onset  . Diabetes Mother   . COPD Father   . Asthma Sister   . COPD Sister   . Cancer Other     Social History Social  History   Tobacco Use  . Smoking status: Light Tobacco Smoker    Packs/day: 0.30    Years: 10.00    Pack years: 3.00    Types: Cigarettes    Last attempt to quit: 07/23/2011    Years since quitting: 8.0  . Smokeless tobacco: Never Used  Substance Use Topics  . Alcohol use: No  . Drug use: No     Allergies   Penicillins, Sulfa antibiotics, Other, and Apple   Review of Systems Review of Systems  Musculoskeletal: Positive for arthralgias.  Neurological: Negative for headaches.     Physical Exam Updated Vital Signs BP 111/72   Pulse 70   Temp 97.9 F (36.6 C) (Oral)   Resp 18   SpO2 98%   Physical Exam CONSTITUTIONAL: Well developed/well nourished HEAD: Normocephalic/atraumatic EYES: EOMI ENMT: Mucous membranes moist NECK: supple no meningeal signs LUNGS:  no apparent distress ABDOMEN: soft GU:no cva tenderness NEURO: Pt is awake/alert/appropriate, moves all extremitiesx4.  No facial droop.   EXTREMITIES: pulses normal/equal, full ROM, tenderness to dorsal aspect of left  wrist.  No deformities.  Distal pulses intact.  No snuffbox tenderness.  She can range her left wrist.  No other signs of traumatic injury SKIN: warm, color normal PSYCH: no abnormalities of mood noted, alert and oriented to situation   ED Treatments / Results  Labs (all labs ordered are listed, but only abnormal results are displayed) Labs Reviewed - No data to display  EKG None  Radiology Dg Wrist Complete Left  Result Date: 08/04/2019 CLINICAL DATA:  Fall with wrist pain EXAM: LEFT WRIST - COMPLETE 3+ VIEW COMPARISON:  None. FINDINGS: There is no evidence of fracture or dislocation. There is no evidence of arthropathy or other focal bone abnormality. Soft tissues are unremarkable. IMPRESSION: Negative. Electronically Signed   By: Jasmine Pang M.D.   On: 08/04/2019 00:20    Procedures Procedures  Medications Ordered in ED Medications  HYDROcodone-acetaminophen (NORCO/VICODIN) 5-325  MG per tablet 1 tablet (has no administration in time range)     Initial Impression / Assessment and Plan / ED Course  I have reviewed the triage vital signs and the nursing notes.  Pertinent maging results that were available during my care of the patient were reviewed by me and considered in my medical decision making (see chart for details).        X-ray negative.  Patient likely with sprain Velcro splint ordered. Refer to Ortho  Final Clinical Impressions(s) / ED Diagnoses   Final diagnoses:  Sprain of left wrist, initial encounter    ED Discharge Orders    None       Zadie Rhine, MD 08/04/19 Lyn Records, MD 08/04/19 0127

## 2019-08-05 ENCOUNTER — Other Ambulatory Visit: Payer: Self-pay

## 2019-08-05 DIAGNOSIS — Z20822 Contact with and (suspected) exposure to covid-19: Secondary | ICD-10-CM

## 2019-08-06 LAB — NOVEL CORONAVIRUS, NAA: SARS-CoV-2, NAA: NOT DETECTED

## 2020-08-01 ENCOUNTER — Ambulatory Visit (INDEPENDENT_AMBULATORY_CARE_PROVIDER_SITE_OTHER): Payer: Self-pay | Admitting: Cardiology

## 2020-08-01 ENCOUNTER — Other Ambulatory Visit: Payer: Self-pay

## 2020-08-01 ENCOUNTER — Encounter: Payer: Self-pay | Admitting: Cardiology

## 2020-08-01 VITALS — BP 116/70 | HR 64 | Ht 66.0 in | Wt 132.0 lb

## 2020-08-01 DIAGNOSIS — F17200 Nicotine dependence, unspecified, uncomplicated: Secondary | ICD-10-CM

## 2020-08-01 DIAGNOSIS — I200 Unstable angina: Secondary | ICD-10-CM

## 2020-08-01 DIAGNOSIS — I2510 Atherosclerotic heart disease of native coronary artery without angina pectoris: Secondary | ICD-10-CM

## 2020-08-01 DIAGNOSIS — R9431 Abnormal electrocardiogram [ECG] [EKG]: Secondary | ICD-10-CM

## 2020-08-01 LAB — BASIC METABOLIC PANEL
BUN/Creatinine Ratio: 14 (ref 9–23)
BUN: 14 mg/dL (ref 6–24)
CO2: 27 mmol/L (ref 20–29)
Calcium: 9.3 mg/dL (ref 8.7–10.2)
Chloride: 103 mmol/L (ref 96–106)
Creatinine, Ser: 0.99 mg/dL (ref 0.57–1.00)
GFR calc Af Amer: 79 mL/min/{1.73_m2} (ref >59)
GFR calc non Af Amer: 69 mL/min/{1.73_m2} (ref >59)
Glucose: 63 mg/dL — ABNORMAL LOW (ref 65–99)
Potassium: 4 mmol/L (ref 3.5–5.2)
Sodium: 141 mmol/L (ref 134–144)

## 2020-08-01 MED ORDER — ROSUVASTATIN CALCIUM 10 MG PO TABS: 10.0000 mg | ORAL_TABLET | Freq: Every day | ORAL | 3 refills | Status: DC

## 2020-08-01 MED ORDER — METOPROLOL TARTRATE 100 MG PO TABS: ORAL_TABLET | ORAL | 0 refills | Status: DC

## 2020-08-01 NOTE — Patient Instructions (Addendum)
Medication Instructions:  Your physician has recommended you make the following change in your medication:  1-START Crestor 10 mg by mouth daily  *If you need a refill on your cardiac medications before your next appointment, please call your pharmacy*  Lab Work: Your physician recommends that you have lab work today- BMET Your physician recommends that you return for lab work in: 3 months for fasting lipid panel and ATL  If you have labs (blood work) drawn today and your tests are completely normal, you will receive your results only by: Marland Kitchen MyChart Message (if you have MyChart) OR . A paper copy in the mail If you have any lab test that is abnormal or we need to change your treatment, we will call you to review the results.  Testing/Procedures: Your physician has requested that you have cardiac CT. Cardiac computed tomography (CT) is a painless test that uses an x-ray machine to take clear, detailed pictures of your heart. For further information please visit HugeFiesta.[email or web address]. Please follow instruction sheet as given.  Follow-Up: At [facility], you and your health needs are our priority.  As part of our continuing mission to provide you with exceptional heart care, we have created designated Provider Care Teams.  These Care Teams include your primary Cardiologist (physician) and Advanced Practice Providers (APPs -  Physician Assistants and Nurse Practitioners) who all work together to provide you with the care you need, when you need it.  We recommend signing up for the patient portal called "MyChart".  Sign up information is provided on this After Visit Summary.  MyChart is used to connect with patients for Virtual Visits (Telemedicine).  Patients are able to view lab/test results, encounter notes, upcoming appointments, etc.  Non-urgent messages can be sent to your provider as well.   To learn more about what you can do with MyChart, go to [email or web address].    Your next  appointment:   As needed  The format for your next appointment:   In Person  Provider:   You may see Candee Furbish, MD or one of the following Advanced Practice Providers on your designated Care Team:    Truitt Merle, NP  Cecilie Kicks, NP  Kathyrn Drown, NP    Other Instructions Your cardiac CT will be scheduled at one of the below locations:   [facility] [address] [place], [place] [address] [phone]   If scheduled at [facility], please arrive at the [facility] main entrance of [facility] 30 minutes prior to test start time. Proceed to the [facility] Radiology Department (first floor) to check-in and test prep.  Please follow these instructions carefully (unless otherwise directed):  On the Night Before the Test: . Be sure to Drink plenty of water. . Do not consume any caffeinated/decaffeinated beverages or chocolate 12 hours prior to your test. . Do not take any antihistamines 12 hours prior to your test.  On the Day of the Test: . Drink plenty of water. Do not drink any water within one hour of the test. . Do not eat any food 4 hours prior to the test. . You may take your regular medications prior to the test.  . Take metoprolol 100 mg (Lopressor) two hours prior to test. . FEMALES- please wear underwire-free bra if available  After the Test: . Drink plenty of water. . After receiving IV contrast, you may experience a mild flushed feeling. This is normal. . On occasion, you may experience a  mild rash up to 24 hours after the test. This is not dangerous. If this occurs, you can take Benadryl 25 mg and increase your fluid intake. . If you experience trouble breathing, this can be serious. If it is severe call 911 IMMEDIATELY. If it is mild, please call our office.   Once we have confirmed authorization from your insurance company, we will call you to set up a date and time for your test. Based on how quickly your insurance  processes prior authorizations requests, please allow up to 4 weeks to be contacted for scheduling your Cardiac CT appointment. Be advised that routine Cardiac CT appointments could be scheduled as many as 8 weeks after your provider has ordered it.  For non-scheduling related questions, please contact the cardiac imaging nurse navigator should you have any questions/concerns: Marchia Bond, Cardiac Imaging Nurse Navigator Burley Saver, Interim Cardiac Imaging Nurse [place] [facility] V[facility] Services Direct Office Dial: [phone]   For scheduling needs, including cancellations and rescheduling, please call Vivien Rota at [phone], option 3.

## 2020-08-01 NOTE — Progress Notes (Addendum)
Cardiology Office Note:    Date:  08/01/2020   ID:  Kendra Moreno, DOB [birth year], MRN [id number]  PCP:  Leonie Douglas, MD  [facility] Cardiologist:  Candee Furbish, MD  [facility] Electrophysiologist:  None   Referring MD: Leonie Douglas, MD     History of Present Illness:    Kendra Moreno is a 44 y.o. female here for the evaluation of calcification of the LAD artery noted in hospitalization from October 2020 at the request of Dr. Leonie Douglas in [facility].  She is a smoker approximately 5 cigarettes/day.  Review of last office note from Dr. Marlon Pel on 06/12/2020 reveals that she was hospitalized in last October and had a CT scan of her heart that showed calcification of the left anterior descending artery.  Her EKG also showed some nonspecific changes in the lateral leads.  I was also able to review a prior office note from Dr. Hamilton Capri at [facility] from 01/23/2015 where she had had recurrent episodes of chest pain.  Substernal sharp no radiation.  Family history was significant for CAD.  An exercise stress test was performed and was negative, able to exercise for 13 minutes on 02/15/2015.  EKG from 05/14/2020 personally reviewed and interpreted shows sinus rhythm with T wave inversions noted in lead V2 and V3, nonspecific as well as heart rate of 48 bpm.  Having chest pain frequently, left arm pain. Tired in evenengs. Sometimes radiates to shoulders. Going up hill stops her in pain.   She has had her mother-in-law recently died from heart attack.  Her husband is here with her.  Clearly anxious about this.  Past Medical History:  Diagnosis Date  . CAD (coronary artery disease)   . Cigarette smoker   . COPD (chronic obstructive pulmonary disease) ([place])   . EKG abnormalities   . Heart block   . Seizures ([place])     Past Surgical History:  Procedure Laterality Date  . ACNE CYST REMOVAL    . CESAREAN SECTION      Current Medications: Current  Meds  Medication Sig  . aspirin EC 81 MG tablet Take 81 mg by mouth daily. Swallow whole.     Allergies:   Penicillins, Sulfa antibiotics, Other, and Apple   Social History   Socioeconomic History  . Marital status: Single    Spouse name: Not on file  . Number of children: Not on file  . Years of education: Not on file  . Highest education level: Not on file  Occupational History  . Not on file  Tobacco Use  . Smoking status: Light Tobacco Smoker    Packs/day: 0.30    Years: 10.00    Pack years: 3.00    Types: Cigarettes    Last attempt to quit: 07/23/2011    Years since quitting: 9.0  . Smokeless tobacco: Never Used  Vaping Use  . Vaping Use: Never used  Substance and Sexual Activity  . Alcohol use: No  . Drug use: No  . Sexual activity: Yes  Other Topics Concern  . Not on file  Social History Narrative  . Not on file   Social Determinants of Health   Financial Resource Strain:   . Difficulty of Paying Living Expenses: Not on file  Food Insecurity:   . Worried About Charity fundraiser in the Last Year: Not on file  . Ran Out of Food in the Last Year: Not on file  Transportation Needs:   .  Lack of Transportation (Medical): Not on file  . Lack of Transportation (Non-Medical): Not on file  Physical Activity:   . Days of Exercise per Week: Not on file  . Minutes of Exercise per Session: Not on file  Stress:   . Feeling of Stress : Not on file  Social Connections:   . Frequency of Communication with Friends and Family: Not on file  . Frequency of Social Gatherings with Friends and Family: Not on file  . Attends Religious Services: Not on file  . Active Member of Clubs or Organizations: Not on file  . Attends Archivist Meetings: Not on file  . Marital Status: Not on file     Family History: The patient's family history includes Asthma in her sister; COPD in her father and sister; Cancer in her mother and another family member; Diabetes in her  mother.  ROS:   Please see the history of present illness.     All other systems reviewed and are negative.  EKGs/Labs/Other Studies Reviewed:    Recent Labs: No results found for requested labs within last 8760 hours.  Recent Lipid Panel No results found for: CHOL, TRIG, HDL, CHOLHDL, VLDL, LDLCALC, LDLDIRECT   Risk Assessment/Calculations:       Physical Exam:    VS:  BP 116/70   Pulse 64   Ht 5' 6"  (1.676 m)   Wt 132 lb (59.9 kg)   SpO2 96%   BMI 21.31 kg/m     Wt Readings from Last 3 Encounters:  08/01/20 132 lb (59.9 kg)  10/25/17 115 lb (52.2 kg)  07/27/11 133 lb 12.8 oz (60.7 kg)     GEN:  Well nourished, well developed in no acute distress HEENT: Normal NECK: No JVD; No carotid bruits LYMPHATICS: No lymphadenopathy CARDIAC: RRR, no murmurs, rubs, gallops RESPIRATORY:  Clear to auscultation without rales, wheezing or rhonchi  ABDOMEN: Soft, non-tender, non-distended MUSCULOSKELETAL:  No edema; No deformity  SKIN: Warm and dry NEUROLOGIC:  Alert and oriented x 3 PSYCHIATRIC:  Normal affect, anxious  ASSESSMENT:    1. Unstable angina ([place])   2. Coronary artery disease involving native coronary artery of native heart without angina pectoris   3. Nonspecific abnormal electrocardiogram (ECG) (EKG)   4. Smoker    PLAN:    In order of problems listed above:  Coronary calcification/coronary artery atherosclerosis/abnormal EKG -LAD calcification noted on prior CT scan as an incidental finding -Abnormal EKG showing T wave inversion in the anterior leads.  Is new when compared to prior EKG reviewed from 2012. -Continues to have ongoing chest discomfort with and without exertion. -Abnormal EKG shows some T wave inversion in V2 and V3. -I think it would make sense for her to proceed with coronary CT scan with possible FFR analysis.  Hopefully, her calcium burden is not prohibitive to reading dual lumen of the vessel. -We also talked about the possibility of  cardiac catheterization.  She is very nervous about that and wants to avoid if possible.  Understand.  Hopefully we can help expedite CT scan.  She is quite nervous. -We will go ahead and start Crestor 10 mg a day with a plan to increase to 20 mg a day.  Discussed potential side effects rarity of them.  Recheck lipid panel and ALT in 3 months. -Continue with aspirin 81.   Medication Adjustments/Labs and Tests Ordered: Current medicines are reviewed at length with the patient today.  Concerns regarding medicines are outlined above.  Orders  Placed This Encounter  Procedures  . CT CORONARY MORPH W/CTA COR W/SCORE W/CA W/CM &/OR WO/CM  . CT CORONARY FRACTIONAL FLOW RESERVE DATA PREP  . CT CORONARY FRACTIONAL FLOW RESERVE FLUID ANALYSIS  . ALT  . Lipid panel  . Basic Metabolic Panel (BMET)   Meds ordered this encounter  Medications  . rosuvastatin (CRESTOR) 10 MG tablet    Sig: Take 1 tablet (10 mg total) by mouth daily.    Dispense:  90 tablet    Refill:  3  . metoprolol tartrate (LOPRESSOR) 100 MG tablet    Sig: Take one tablet 2 hours prior to CT scan.    Dispense:  1 tablet    Refill:  0    Patient Instructions  Medication Instructions:  Your physician has recommended you make the following change in your medication:  1-START Crestor 10 mg by mouth daily  *If you need a refill on your cardiac medications before your next appointment, please call your pharmacy*  Lab Work: Your physician recommends that you have lab work today- BMET Your physician recommends that you return for lab work in: 3 months for fasting lipid panel and ATL  If you have labs (blood work) drawn today and your tests are completely normal, you will receive your results only by: Marland Kitchen MyChart Message (if you have MyChart) OR . A paper copy in the mail If you have any lab test that is abnormal or we need to change your treatment, we will call you to review the results.  Testing/Procedures: Your physician has  requested that you have cardiac CT. Cardiac computed tomography (CT) is a painless test that uses an x-ray machine to take clear, detailed pictures of your heart. For further information please visit HugeFiesta.[email or web address]. Please follow instruction sheet as given.  Follow-Up: At [facility], you and your health needs are our priority.  As part of our continuing mission to provide you with exceptional heart care, we have created designated Provider Care Teams.  These Care Teams include your primary Cardiologist (physician) and Advanced Practice Providers (APPs -  Physician Assistants and Nurse Practitioners) who all work together to provide you with the care you need, when you need it.  We recommend signing up for the patient portal called "MyChart".  Sign up information is provided on this After Visit Summary.  MyChart is used to connect with patients for Virtual Visits (Telemedicine).  Patients are able to view lab/test results, encounter notes, upcoming appointments, etc.  Non-urgent messages can be sent to your provider as well.   To learn more about what you can do with MyChart, go to [email or web address].    Your next appointment:   As needed  The format for your next appointment:   In Person  Provider:   You may see Candee Furbish, MD or one of the following Advanced Practice Providers on your designated Care Team:    Truitt Merle, NP  Cecilie Kicks, NP  Kathyrn Drown, NP    Other Instructions Your cardiac CT will be scheduled at one of the below locations:   [facility] [address] [place], [place] [address] [phone]   If scheduled at [facility], please arrive at the [facility] main entrance of [facility] 30 minutes prior to test start time. Proceed to the [facility] Radiology Department (first floor) to check-in and test prep.  Please follow these instructions carefully (unless otherwise directed):  On the Night Before the  Test: . Be  sure to Drink plenty of water. . Do not consume any caffeinated/decaffeinated beverages or chocolate 12 hours prior to your test. . Do not take any antihistamines 12 hours prior to your test.  On the Day of the Test: . Drink plenty of water. Do not drink any water within one hour of the test. . Do not eat any food 4 hours prior to the test. . You may take your regular medications prior to the test.  . Take metoprolol 100 mg (Lopressor) two hours prior to test. . FEMALES- please wear underwire-free bra if available  After the Test: . Drink plenty of water. . After receiving IV contrast, you may experience a mild flushed feeling. This is normal. . On occasion, you may experience a mild rash up to 24 hours after the test. This is not dangerous. If this occurs, you can take Benadryl 25 mg and increase your fluid intake. . If you experience trouble breathing, this can be serious. If it is severe call 911 IMMEDIATELY. If it is mild, please call our office.   Once we have confirmed authorization from your insurance company, we will call you to set up a date and time for your test. Based on how quickly your insurance processes prior authorizations requests, please allow up to 4 weeks to be contacted for scheduling your Cardiac CT appointment. Be advised that routine Cardiac CT appointments could be scheduled as many as 8 weeks after your provider has ordered it.  For non-scheduling related questions, please contact the cardiac imaging nurse navigator should you have any questions/concerns: Marchia Bond, Cardiac Imaging Nurse Navigator Burley Saver, Interim Cardiac Imaging Nurse [place] and Vascular Services Direct Office Dial: [phone]   For scheduling needs, including cancellations and rescheduling, please call Vivien Rota at [phone], option 3.        Signed, Candee Furbish, MD  08/01/2020 11:44 AM    [facility]  Addendum: We were  notified that CT scan would not be covered by her Medicaid.  Because of this, we will look into performing a Lexiscan stress test.  Candee Furbish, MD

## 2020-08-02 ENCOUNTER — Telehealth: Payer: Self-pay | Admitting: *Deleted

## 2020-08-02 ENCOUNTER — Encounter: Payer: Self-pay | Admitting: *Deleted

## 2020-08-02 DIAGNOSIS — I200 Unstable angina: Secondary | ICD-10-CM

## 2020-08-02 DIAGNOSIS — R9431 Abnormal electrocardiogram [ECG] [EKG]: Secondary | ICD-10-CM

## 2020-08-02 DIAGNOSIS — R072 Precordial pain: Secondary | ICD-10-CM

## 2020-08-02 NOTE — Telephone Encounter (Signed)
attempted to contact pt regarding lab results and the need for Lexiscan Myoview in place of the Coronary CT since her insurance will not cover it. Requested pt c/b to discuss and review instructions.

## 2020-08-02 NOTE — Telephone Encounter (Signed)
Pt is returning call.  

## 2020-08-02 NOTE — Telephone Encounter (Signed)
Left message to c/b.

## 2020-08-02 NOTE — Telephone Encounter (Signed)
Excellent lab work. Normal kidney function.  Kendra Moreno was notified that CT scan of her heart would not be covered by her insurance. I asked her to investigate obtaining a Lexiscan nuclear stress test. Please follow-up on this. Thank you.  Donato Schultz, MD   Order placed for [facility].

## 2020-08-03 NOTE — Telephone Encounter (Signed)
Spoke with patient and let her know the cCTA was not approved.  In place of this Dr. Anne Fu would like her to have a lexiscan myoview.  Reviewed instructions for this with her.  Adv that she will be called to schedule.  Aware it will be at our office, and not to take the metoprolol that was ordered for the CT.

## 2020-08-03 NOTE — Telephone Encounter (Signed)
Kendra Moreno is returning Pamela's call. Please advise.

## 2020-08-03 NOTE — Telephone Encounter (Signed)
Pt returning call

## 2020-08-07 ENCOUNTER — Telehealth (HOSPITAL_COMMUNITY): Payer: Self-pay

## 2020-08-07 NOTE — Telephone Encounter (Signed)
Spoke with the patient, detailed instructions given. She stated that she understood and would be here for her test in the am. Asked to call back with any questions. S.Shephanie Romas EMTP

## 2020-08-08 ENCOUNTER — Other Ambulatory Visit: Payer: Self-pay

## 2020-08-08 ENCOUNTER — Ambulatory Visit (HOSPITAL_COMMUNITY): Payer: Self-pay | Attending: Cardiovascular Disease

## 2020-08-08 DIAGNOSIS — R072 Precordial pain: Secondary | ICD-10-CM

## 2020-08-08 DIAGNOSIS — R9431 Abnormal electrocardiogram [ECG] [EKG]: Secondary | ICD-10-CM

## 2020-08-08 DIAGNOSIS — I200 Unstable angina: Secondary | ICD-10-CM

## 2020-08-08 LAB — MYOCARDIAL PERFUSION IMAGING
LV dias vol: 65 mL (ref 46–106)
LV sys vol: 19 mL
Peak HR: 103 {beats}/min
Rest HR: 64 {beats}/min
SDS: 1
SRS: 0
SSS: 1
TID: 1.02

## 2020-08-08 MED ORDER — REGADENOSON 0.4 MG/5ML IV SOLN
0.4000 mg | Freq: Once | INTRAVENOUS | Status: AC
  Administered 2020-08-08: 0.4 mg via INTRAVENOUS

## 2020-08-08 MED ORDER — TECHNETIUM TC 99M TETROFOSMIN IV KIT
31.6000 | PACK | Freq: Once | INTRAVENOUS | Status: AC | PRN
  Administered 2020-08-08: 31.6 via INTRAVENOUS
  Filled 2020-08-08: qty 32

## 2020-08-08 MED ORDER — TECHNETIUM TC 99M TETROFOSMIN IV KIT
10.2000 | PACK | Freq: Once | INTRAVENOUS | Status: AC | PRN
  Administered 2020-08-08: 10.2 via INTRAVENOUS
  Filled 2020-08-08: qty 11

## 2020-08-13 ENCOUNTER — Telehealth (HOSPITAL_COMMUNITY): Payer: Self-pay | Admitting: Emergency Medicine

## 2020-08-13 NOTE — Telephone Encounter (Signed)
Reaching out to patient to offer assistance regarding upcoming cardiac imaging study; pt verbalizes understanding of appt date/time, parking situation and where to check in, pre-test NPO status and medications ordered, and verified current allergies; name and call back number provided for further questions should they arise Rockwell Alexandria RN Navigator Cardiac Imaging [facility] [phone] office [phone] cell  Pt reminded to take PO metoprolol 2 hr prior to scan

## 2020-08-14 ENCOUNTER — Ambulatory Visit (HOSPITAL_COMMUNITY): Payer: Self-pay

## 2020-08-21 ENCOUNTER — Telehealth (HOSPITAL_COMMUNITY): Payer: Self-pay | Admitting: Emergency Medicine

## 2020-08-21 NOTE — Telephone Encounter (Signed)
no answering service available to leave message for callback Rockwell Alexandria RN Navigator Cardiac Imaging [facility] [phone] Office  [phone] Cell

## 2020-08-22 ENCOUNTER — Other Ambulatory Visit: Payer: Self-pay

## 2020-08-22 ENCOUNTER — Ambulatory Visit (HOSPITAL_COMMUNITY)
Admission: RE | Admit: 2020-08-22 | Discharge: 2020-08-22 | Disposition: A | Discharge status: Home / Self Care | Payer: Self-pay | Source: Ambulatory Visit | Attending: Cardiology | Admitting: Cardiology

## 2020-08-22 DIAGNOSIS — F17200 Nicotine dependence, unspecified, uncomplicated: Secondary | ICD-10-CM | POA: Insufficient documentation

## 2020-08-22 DIAGNOSIS — I200 Unstable angina: Secondary | ICD-10-CM | POA: Insufficient documentation

## 2020-08-22 DIAGNOSIS — R9431 Abnormal electrocardiogram [ECG] [EKG]: Secondary | ICD-10-CM | POA: Insufficient documentation

## 2020-08-22 DIAGNOSIS — I2510 Atherosclerotic heart disease of native coronary artery without angina pectoris: Secondary | ICD-10-CM | POA: Insufficient documentation

## 2020-08-22 MED ORDER — NITROGLYCERIN 0.4 MG SL SUBL
SUBLINGUAL_TABLET | SUBLINGUAL | Status: AC
  Filled 2020-08-22: qty 2

## 2020-08-22 MED ORDER — IOHEXOL 350 MG/ML SOLN
80.0000 mL | Freq: Once | INTRAVENOUS | Status: AC | PRN
  Administered 2020-08-22: 80 mL via INTRAVENOUS

## 2020-08-22 MED ORDER — NITROGLYCERIN 0.4 MG SL SUBL
0.8000 mg | SUBLINGUAL_TABLET | Freq: Once | SUBLINGUAL | Status: AC
  Administered 2020-08-22: 0.8 mg via SUBLINGUAL

## 2020-08-22 NOTE — Progress Notes (Signed)
Pt informs me that she was told to arrive around prior to her procedure and that it was scheduled for 1215. She also informed me that she took her metoprolol 2 hours prior to exam at 1015 as instructed. I informed her that we had her scheduled for 1130 and she was not aware of this. She states she is unsure of who she spoke with on the phone regarding these times and she was very apologetic for being late. Patient tolerated the exam well. Her vitals remained stable and she was discharged home with husband. She ambulated to the waiting room without assistance/complaints.

## 2020-08-23 ENCOUNTER — Telehealth: Payer: Self-pay | Admitting: Cardiology

## 2020-08-23 NOTE — Telephone Encounter (Signed)
Patient is returning call to discuss CT results. 

## 2020-08-23 NOTE — Telephone Encounter (Signed)
Calcified plaque noted in proximal LAD artery, however the good news is that it is not flow limiting. We have a good opportunity to help stabilize with Crestor. If you are tolerating the Crestor 10mg  well, let's increase it to 20mg  PO QD.   , MD    Attempted to contact pt - LM to cb to review results and orders.

## 2020-08-24 NOTE — Telephone Encounter (Signed)
Calcified plaque noted in proximal LAD artery, however the good news is that it is not flow limiting. We have a good opportunity to help stabilize with Crestor. If you are tolerating the Crestor 10mg  well, let's increase it to 20mg  PO QD.   , MD    The patient has been notified of the result and verbalized understanding.  All questions (if any) were answered. , RN 08/24/2020 9:05 AM

## 2020-08-24 NOTE — Telephone Encounter (Signed)
Patient calling back to go over test results.

## 2020-08-24 NOTE — Telephone Encounter (Signed)
Patient was calling back in to speak with nurse to go over results. Sheleigh contacted her as patient was on phone with me.

## 2020-08-30 ENCOUNTER — Other Ambulatory Visit: Payer: Self-pay | Admitting: *Deleted

## 2020-08-30 DIAGNOSIS — E785 Hyperlipidemia, unspecified: Secondary | ICD-10-CM

## 2020-08-30 DIAGNOSIS — R9431 Abnormal electrocardiogram [ECG] [EKG]: Secondary | ICD-10-CM

## 2020-08-30 DIAGNOSIS — I2510 Atherosclerotic heart disease of native coronary artery without angina pectoris: Secondary | ICD-10-CM

## 2020-08-30 MED ORDER — ROSUVASTATIN CALCIUM 20 MG PO TABS
20.0000 mg | ORAL_TABLET | Freq: Every day | ORAL | 3 refills | Status: DC
Start: 2020-08-30 — End: 2021-06-04

## 2020-09-19 ENCOUNTER — Ambulatory Visit: Payer: Medicaid Other | Admitting: Cardiology

## 2020-10-31 ENCOUNTER — Other Ambulatory Visit: Payer: Self-pay

## 2021-05-30 ENCOUNTER — Encounter: Payer: Self-pay | Admitting: Neurology

## 2021-05-30 ENCOUNTER — Ambulatory Visit: Payer: Medicaid Other | Admitting: Neurology

## 2021-06-04 ENCOUNTER — Ambulatory Visit: Payer: Medicaid Other | Admitting: Neurology

## 2021-06-04 ENCOUNTER — Other Ambulatory Visit: Payer: Self-pay

## 2021-06-04 ENCOUNTER — Encounter: Payer: Self-pay | Admitting: Neurology

## 2021-06-04 VITALS — BP 106/64 | HR 70 | Ht 66.0 in | Wt 127.5 lb

## 2021-06-04 DIAGNOSIS — R202 Paresthesia of skin: Secondary | ICD-10-CM

## 2021-06-04 MED ORDER — DULOXETINE HCL 60 MG PO CPEP
60.0000 mg | ORAL_CAPSULE | Freq: Every day | ORAL | 1 refills | Status: DC
Start: 2021-06-04 — End: 2021-09-09

## 2021-06-04 NOTE — Progress Notes (Signed)
Chief Complaint  Patient presents with   New Patient (Initial Visit)    New room - alone. Referral for NCV/EMG. Rule out bilateral carpal tunnel. 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      ASSESSMENT AND PLAN  Kendra Moreno is a 45 y.o. female  Bilateral hands paresthesia, multiple hands joints pain,  Most suggestive of carpal tunnel syndrome,  EMG nerve conduction study  Laboratory evaluation including inflammatory markers  Cymbalta 60 mg daily    DIAGNOSTIC DATA (LABS, IMAGING, TESTING) - I reviewed patient records, labs, notes, testing and imaging myself where available.   MEDICAL HISTORY:  Kendra Moreno, is a 45 year old female, seen in request by orthopedic surgeon Dr. Madelon Lips, Reuel Boom, for evaluation of bilateral hands paresthesia, her primary care physician is Dr. Waldon Reining, initial evaluation was on June 05, 2021  I reviewed and summarized the referring note. PMHX Chronic Anxiety. History of seizure, single isolated event many years ago.  She works as a Interior and spatial designer, over the past couple years, she has intermittent bilateral hands paresthesia, especially at nighttime, but since February 2022, it becomes much worse, wake her up every few hours, she felt burning of all fingertips, it happened frequently during the daytime as well, in addition, she complains of tenderness and right third finger base, and also difficulty extending her left index finger frequently, multiple hands joints pain,  She has neck tension, but denies significant pain radiating to shoulder or upper extremity, denies gait abnormality,  PHYSICAL EXAM:   Vitals:   06/04/21 1044  BP: 106/64  Pulse: 70  Weight: 127 lb 8 oz (57.8 kg)  Height: 5\' 6"  (1.676 m)   Not recorded     Body mass index is 20.58 kg/m.  PHYSICAL EXAMNIATION:  Gen: NAD, conversant, well nourised, well groomed                     Cardiovascular:  Regular rate rhythm, no peripheral edema, warm, nontender. Eyes: Conjunctivae clear without exudates or hemorrhage Neck: Supple, no carotid bruits. Pulmonary: Clear to auscultation bilaterally   NEUROLOGICAL EXAM:  MENTAL STATUS: Speech:    Speech is normal; fluent and spontaneous with normal comprehension.  Cognition:     Orientation to time, place and person     Normal recent and remote memory     Normal Attention span and concentration     Normal Language, naming, repeating,spontaneous speech     Fund of knowledge   CRANIAL NERVES: CN II: Visual fields are full to confrontation. Pupils are round equal and briskly reactive to light. CN III, IV, VI: extraocular movement are normal. No ptosis. CN V: Facial sensation is intact to light touch CN VII: Face is symmetric with normal eye closure  CN VIII: Hearing is normal to causal conversation. CN IX, X: Phonation is normal. CN XI: Head turning and shoulder shrug are intact  MOTOR: There was enlarged hands joints, but no significant swelling, tenderness at right third metaphalangeal joints, left distal index finger, PIP tenderness, difficult to extend left index finger sometimes, there was no significant weakness, bilateral wrist Tinel signs  REFLEXES: Reflexes are 1 and symmetric at the biceps, triceps, knees, and ankles. Plantar responses are flexor.  SENSORY: Intact to light touch, pinprick and vibratory sensation are intact in fingers and toes.  COORDINATION: There is no trunk or limb dysmetria noted.  GAIT/STANCE: Posture is normal. Gait is steady with normal  steps, base, arm swing, and turning. Heel and toe walking are normal. Tandem gait is normal.  Romberg is absent.  REVIEW OF SYSTEMS:  Full 14 system review of systems performed and notable only for as above All other review of systems were negative.   ALLERGIES: Allergies  Allergen Reactions   Penicillins Anaphylaxis   Sulfa Antibiotics Anaphylaxis   Other  Other (See Comments)    ALL TREE NUTS, can eat peanuts    Apple Itching and Rash    HOME MEDICATIONS: Current Outpatient Medications  Medication Sig Dispense Refill   diazepam (VALIUM) 2 MG tablet Take 2 mg by mouth daily as needed.     DULoxetine (CYMBALTA) 30 MG capsule Take 30 mg by mouth daily.     hydrOXYzine (ATARAX/VISTARIL) 10 MG tablet Take 10 mg by mouth 4 (four) times daily as needed.     No current facility-administered medications for this visit.    PAST MEDICAL HISTORY: Past Medical History:  Diagnosis Date   Anxiety    CAD (coronary artery disease)    Cigarette smoker    COPD (chronic obstructive pulmonary disease) (HCC)    EKG abnormalities    Heart block    Hypercholesteremia    Seizures (HCC)     PAST SURGICAL HISTORY: Past Surgical History:  Procedure Laterality Date   ACNE CYST REMOVAL     CESAREAN SECTION      FAMILY HISTORY: Family History  Problem Relation Age of Onset   Diabetes Mother    Colon cancer Mother    COPD Father    Asthma Sister    COPD Sister    Cancer Other     SOCIAL HISTORY: Social History   Socioeconomic History   Marital status: Single    Spouse name: Not on file   Number of children: 2   Years of education: college   Highest education level: Not on file  Occupational History   Occupation: Hairdresser  Tobacco Use   Smoking status: Light Smoker    Packs/day: 0.30    Years: 10.00    Pack years: 3.00    Types: Cigarettes    Last attempt to quit: 07/23/2011    Years since quitting: 9.8   Smokeless tobacco: Never  Vaping Use   Vaping Use: Never used  Substance and Sexual Activity   Alcohol use: No   Drug use: No   Sexual activity: Yes  Other Topics Concern   Not on file  Social History Narrative   Lives at home with family.   Right-handed.   Two cups caffeine per day.    Social Determinants of Health   Financial Resource Strain: Not on file  Food Insecurity: Not on file  Transportation Needs: Not  on file  Physical Activity: Not on file  Stress: Not on file  Social Connections: Not on file  Intimate Partner Violence: Not on file      Levert Feinstein, M.D. Ph.D.  [facility] [address] [place], [place] [address] Ph: [phone] Fax: [phone]  CC:  Frederico Hamman, MD [address] [place],  [place] [address]  Waldon Reining, MD

## 2021-06-05 ENCOUNTER — Telehealth: Payer: Self-pay | Admitting: Neurology

## 2021-06-05 LAB — COMPREHENSIVE METABOLIC PANEL
ALT: 14 IU/L (ref 0–32)
AST: 21 IU/L (ref 0–40)
Albumin/Globulin Ratio: 2.1 (ref 1.2–2.2)
Albumin: 4.5 g/dL (ref 3.8–4.8)
Alkaline Phosphatase: 99 IU/L (ref 44–121)
BUN/Creatinine Ratio: 9 (ref 9–23)
BUN: 8 mg/dL (ref 6–24)
Bilirubin Total: 0.8 mg/dL (ref 0.0–1.2)
CO2: 29 mmol/L (ref 20–29)
Calcium: 9.5 mg/dL (ref 8.7–10.2)
Chloride: 102 mmol/L (ref 96–106)
Creatinine, Ser: 0.87 mg/dL (ref 0.57–1.00)
Globulin, Total: 2.1 g/dL (ref 1.5–4.5)
Glucose: 70 mg/dL (ref 65–99)
Potassium: 4.6 mmol/L (ref 3.5–5.2)
Sodium: 142 mmol/L (ref 134–144)
Total Protein: 6.6 g/dL (ref 6.0–8.5)
eGFR: 83 mL/min/{1.73_m2} (ref >59)

## 2021-06-05 LAB — TSH: TSH: 1.4 u[IU]/mL (ref 0.450–4.500)

## 2021-06-05 LAB — CBC WITH DIFFERENTIAL/PLATELET
Basophils Absolute: 0.1 10*3/uL (ref 0.0–0.2)
Basos: 1 %
EOS (ABSOLUTE): 0 10*3/uL (ref 0.0–0.4)
Eos: 1 %
Hematocrit: 41.3 % (ref 34.0–46.6)
Hemoglobin: 13.8 g/dL (ref 11.1–15.9)
Immature Grans (Abs): 0 10*3/uL (ref 0.0–0.1)
Immature Granulocytes: 0 %
Lymphocytes Absolute: 1.4 10*3/uL (ref 0.7–3.1)
Lymphs: 18 %
MCH: 31.4 pg (ref 26.6–33.0)
MCHC: 33.4 g/dL (ref 31.5–35.7)
MCV: 94 fL (ref 79–97)
Monocytes Absolute: 0.6 10*3/uL (ref 0.1–0.9)
Monocytes: 7 %
Neutrophils Absolute: 6.1 10*3/uL (ref 1.4–7.0)
Neutrophils: 73 %
Platelets: 300 10*3/uL (ref 150–450)
RBC: 4.39 x10E6/uL (ref 3.77–5.28)
RDW: 12.6 % (ref 11.7–15.4)
WBC: 8.1 10*3/uL (ref 3.4–10.8)

## 2021-06-05 LAB — ANA W/REFLEX IF POSITIVE: Anti Nuclear Antibody (ANA): NEGATIVE

## 2021-06-05 LAB — VITAMIN B12: Vitamin B-12: 283 pg/mL (ref 232–1245)

## 2021-06-05 LAB — SEDIMENTATION RATE: Sed Rate: 7 mm/hr (ref 0–32)

## 2021-06-05 LAB — RPR: RPR Ser Ql: NONREACTIVE

## 2021-06-05 LAB — C-REACTIVE PROTEIN: CRP: 5 mg/L (ref 0–10)

## 2021-06-05 LAB — RHEUMATOID FACTOR: Rheumatoid fact SerPl-aCnc: <10 IU/mL (ref <14.0)

## 2021-06-05 LAB — HGB A1C W/O EAG: Hgb A1c MFr Bld: 5.4 % (ref 4.8–5.6)

## 2021-06-05 NOTE — Telephone Encounter (Signed)
Please call patient, laboratory evaluation showed low normal B12, she would benefit over-the-counter B12 supplement, 1000 mcg daily  There was no other significant abnormalities.

## 2021-06-06 NOTE — Telephone Encounter (Signed)
I spoke to the patient. She verbalized understanding of the findings and will start the recommended supplement.

## 2021-06-10 ENCOUNTER — Telehealth: Payer: Self-pay | Admitting: Neurology

## 2021-06-10 NOTE — Telephone Encounter (Signed)
Pt called wanting to know if Dr. Terrace Arabia could call ina prescription for her. She states she is having really bad inflammation and tingling in her hands. Pt requesting a call back.

## 2021-06-10 NOTE — Telephone Encounter (Signed)
Per note in Epic, duloxetine 60mg  daily was sent to the pharmacy on 06/04/21. I called the patient and left message with his information. Provided our number to call back with any questions.

## 2021-06-17 ENCOUNTER — Ambulatory Visit: Payer: Medicaid Other | Admitting: Neurology

## 2021-07-03 ENCOUNTER — Ambulatory Visit: Payer: Medicaid Other | Admitting: Neurology

## 2021-07-23 ENCOUNTER — Ambulatory Visit: Payer: Medicaid Other | Admitting: Neurology

## 2021-08-14 ENCOUNTER — Ambulatory Visit (INDEPENDENT_AMBULATORY_CARE_PROVIDER_SITE_OTHER): Payer: Medicaid Other | Admitting: Neurology

## 2021-08-14 ENCOUNTER — Ambulatory Visit: Payer: Medicaid Other | Admitting: Neurology

## 2021-08-14 ENCOUNTER — Other Ambulatory Visit: Payer: Self-pay

## 2021-08-14 DIAGNOSIS — G5603 Carpal tunnel syndrome, bilateral upper limbs: Secondary | ICD-10-CM | POA: Diagnosis not present

## 2021-08-14 DIAGNOSIS — R202 Paresthesia of skin: Secondary | ICD-10-CM | POA: Diagnosis not present

## 2021-08-14 NOTE — Progress Notes (Signed)
No chief complaint on file.     ASSESSMENT AND PLAN  Kendra Moreno is a 45 y.o. female  Bilateral carpal tunnel syndromes,  Moderate to severe, right worse than left, demyelinating in nature,  Failed conservative treatment, NSAIDs, Cymbalta, wrist splint,  Will refer her to hand surgeon Dr. Fredna Dow for potential decompression     DIAGNOSTIC DATA (LABS, IMAGING, TESTING) - I reviewed patient records, labs, notes, testing and imaging myself where available. Laboratory evaluation in August 2022 showed normal negative rheumatoid factor, ESR C-reactive protein, ANA, A1c, CBC CMP, TSH, RPR, low normal B12 283  MEDICAL HISTORY:  Kendra Moreno, is a 46 year old female, seen in request by orthopedic surgeon Dr. Earlie Server, for evaluation of bilateral hands paresthesia, her primary care physician is Dr. Leonie Douglas, initial evaluation was on June 05, 2021  I reviewed and summarized the referring note. PMHX Chronic Anxiety. History of seizure, single isolated event many years ago.  She works as a Theme park manager, over the past couple years, she has intermittent bilateral hands paresthesia, especially at nighttime, but since February 2022, it becomes much worse, wake her up every few hours, she felt burning of all fingertips, it happened frequently during the daytime as well, in addition, she complains of tenderness and right third finger base, and also difficulty extending her left index finger frequently, multiple hands joints pain,  She has neck tension, but denies significant pain radiating to shoulder or upper extremity, denies gait abnormality,  Update August 14, 2021: She continue has bilateral hands paresthesia, pain, finger lock up, to the point, 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  Not recorded     There is no height or weight on file to calculate BMI.  PHYSICAL EXAMNIATION:  Gen: NAD, conversant, well nourised, well groomed             NEUROLOGICAL EXAM:  MENTAL STATUS: Speech/cognition: Awake, alert, oriented to history taking care of conversation.   CRANIAL NERVES: CN II: Visual fields are full to confrontation. Pupils are round equal and briskly reactive to light. CN III, IV, VI: extraocular movement are normal. No ptosis. CN V: Facial sensation is intact to light touch CN VII: Face is symmetric with normal eye closure  CN VIII: Hearing is normal to causal conversation. CN IX, X: Phonation is normal. CN XI: Head turning and shoulder shrug are intact  MOTOR: There was enlarged hands joints, but no significant swelling, tenderness at right third metaphalangeal joints, left distal index finger, PIP tenderness, difficult to extend left index finger sometimes, mild bilateral adductor pollicis brevis weakness, bilateral wrist Tinel signs,  REFLEXES: Reflexes are 1 and symmetric at the biceps, triceps, knees, and ankles. Plantar responses are flexor.  SENSORY: Intact to light touch, pinprick and vibratory sensation are intact in fingers and toes.  COORDINATION: There is no trunk or limb dysmetria noted.  GAIT/STANCE: Posture is normal. Gait is steady  REVIEW OF SYSTEMS:  Full 14 system review of systems performed and notable only for as above All other review of systems were negative.   ALLERGIES: Allergies  Allergen Reactions   Penicillins Anaphylaxis   Sulfa Antibiotics Anaphylaxis   Other Other (See Comments)    ALL TREE NUTS, can eat peanuts    Apple Itching and  Rash    HOME MEDICATIONS: Current Outpatient Medications  Medication Sig Dispense Refill   diazepam (VALIUM) 2 MG tablet Take 2 mg by mouth daily as needed.     DULoxetine (CYMBALTA) 60 MG capsule Take 1 capsule (60 mg total) by mouth daily. 90 capsule 1   hydrOXYzine (ATARAX/VISTARIL) 10 MG tablet Take 10 mg by mouth 4 (four) times daily as needed.     vitamin B-12 (CYANOCOBALAMIN) 1000 MCG tablet Take 1,000 mcg by mouth daily.     No current facility-administered medications for this visit.    PAST MEDICAL HISTORY: Past Medical History:  Diagnosis Date   Anxiety    CAD (coronary artery disease)    Cigarette smoker    COPD (chronic obstructive pulmonary disease) (HCC)    EKG abnormalities    Heart block    Hypercholesteremia    Seizures (HCC)     PAST SURGICAL HISTORY: Past Surgical History:  Procedure Laterality Date   ACNE CYST REMOVAL     CESAREAN SECTION      FAMILY HISTORY: Family History  Problem Relation Age of Onset   Diabetes Mother    Colon cancer Mother    COPD Father    Asthma Sister    COPD Sister    Cancer Other     SOCIAL HISTORY: Social History   Socioeconomic History   Marital status: Single    Spouse name: Not on file   Number of children: 2   Years of education: college   Highest education level: Not on file  Occupational History   Occupation: Hairdresser  Tobacco Use   Smoking status: Light Smoker    Packs/day: 0.30    Years: 10.00    Pack years: 3.00    Types: Cigarettes    Last attempt to quit: 07/23/2011    Years since quitting: 10.0   Smokeless tobacco: Never  Vaping Use   Vaping Use: Never used  Substance and Sexual Activity   Alcohol use: No   Drug use: No   Sexual activity: Yes  Other Topics Concern   Not on file  Social History Narrative   Lives at home with family.   Right-handed.   Two cups caffeine per day.    Social Determinants of Health   Financial Resource Strain: Not on file  Food Insecurity: Not on  file  Transportation Needs: Not on file  Physical Activity: Not on file  Stress: Not on file  Social Connections: Not on file  Intimate Partner Violence: Not on file      Marcial Pacas, M.D. Ph.D.  [facility] [address], [place], [place] [address] Ph: [phone] Fax: [phone]  CC:  Leonie Douglas, MD [address] [place],  [place] [address]  Practice, [facility]

## 2021-08-14 NOTE — Procedures (Signed)
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  Bilateral ulnar sensory and motor responses were within normal limit. Bilateral median motor responses showed moderately prolonged distal latency, left median CMAP amplitude was within normal limit, right median had  moderately decreased CMAP amplitude.  Electromyography: Selected needle examination of bilateral upper extremity muscles were performed.  There was no evidence of active denervation at bilateral abductor pollicis brevis, mildly enlarged motor unit potential with mildly decreased recruitment patterns.  Conclusion: This is an abnormal study.  There is electrodiagnostic evidence of bilateral median neuropathy across the wrist, consistent with bilateral carpal tunnel syndromes, right side is severe, left side is moderate; demyelinating in nature, no evidence of axonal loss.      ------------------------------- Levert Feinstein, M.D. PhD  [facility] [address] [place], [place] [address] Tel: [phone] Fax: [phone]  Verbal informed consent was obtained from the patient, patient was informed of potential risk of procedure, including bruising, bleeding, hematoma formation, infection, muscle weakness, muscle pain, numbness, among others.        MNC    Nerve / Sites Muscle Latency Ref. Amplitude Ref. Rel Amp Segments Distance Velocity Ref. Area    ms ms mV mV %  cm m/s m/s mVms  L Median - APB     Wrist APB 6.8 ?4.4 4.4 ?4.0 100 Wrist -  APB 7   14.7     Upper arm APB 11.1  4.3  98.7 Upper arm - Wrist 22 52 ?49 15.1  R Median - APB     Wrist APB 5.0 ?4.4 2.6 ?4.0 100 Wrist - APB 7   9.6     Upper arm APB 9.4  2.6  97 Upper arm - Wrist 22 49 ?49 8.4  L Ulnar - ADM     Wrist ADM 2.5 ?3.3 9.6 ?6.0 100 Wrist - ADM 7   30.0     B.Elbow ADM 5.7  8.7  91.2 B.Elbow - Wrist 20 63 ?49 29.3     A.Elbow ADM 7.3  8.7  99.6 A.Elbow - B.Elbow 10 62 ?49 29.1  R Ulnar - ADM     Wrist ADM 2.5 ?3.3 9.2 ?6.0 100 Wrist - ADM 7   23.0     B.Elbow ADM 5.7  7.7  84.5 B.Elbow - Wrist 20 62 ?49 20.2     A.Elbow ADM 7.4  7.6  98.5 A.Elbow - B.Elbow 10 61 ?49 20.2             SNC    Nerve / Sites Rec. Site Peak Lat Ref.  Amp Ref. Segments Distance    ms ms V V  cm  L Median - Orthodromic (Dig II, Mid palm)     Dig II Wrist NR ?3.4 NR ?10 Dig II - Wrist 13  R Median - Orthodromic (Dig II, Mid palm)     Dig II Wrist NR ?3.4 NR ?10 Dig II - Wrist 13  L Ulnar - Orthodromic, (Dig V, Mid palm)     Dig V Wrist 2.6 ?  3.1 10 ?5 Dig V - Wrist 11  R Ulnar - Orthodromic, (Dig V, Mid palm)     Dig V Wrist 2.8 ?3.1 8 ?5 Dig V - Wrist 29             F  Wave    Nerve F Lat Ref.   ms ms  L Ulnar - ADM 27.0 ?32.0  R Ulnar - ADM 26.4 ?32.0         EMG Summary Table    Spontaneous MUAP Recruitment  Muscle IA Fib PSW Fasc Other Amp Dur. Poly Pattern  R. Abductor pollicis brevis Normal None None None _______ Normal Increased 1+ Reduced  R. First dorsal interosseous Normal None None None _______ Normal Normal Normal Normal  R. Pronator teres Normal None None None _______ Normal Normal Normal Normal  R. Biceps brachii Normal None None None _______ Normal Normal Normal Normal  R. Deltoid Normal None None None _______ Normal Normal Normal Normal  R. Triceps brachii Normal None None None _______ Normal Normal Normal Normal  R. Brachioradialis Normal None None None _______ Normal Normal Normal Normal  L. Abductor pollicis brevis Normal None None None _______  Normal Normal Normal Reduced  L. First dorsal interosseous Normal None None None _______ Normal Normal Normal Normal

## 2021-08-27 ENCOUNTER — Other Ambulatory Visit: Payer: Self-pay | Admitting: Orthopedic Surgery

## 2021-08-27 ENCOUNTER — Telehealth: Payer: Self-pay | Admitting: *Deleted

## 2021-08-27 NOTE — Telephone Encounter (Signed)
   [facility] Pre-operative Risk Assessment    Patient Name: Kendra Moreno  DOB: 1976/02/13 MRN: [id number]  HEARTCARE STAFF:  - IMPORTANT!!!!!! Under Visit Info/Reason for Call, type in Other and utilize the format Clearance MM/DD/YY or Clearance TBD. Do not use dashes or single digits. - Please review there is not already an duplicate clearance open for this procedure. - If request is for dental extraction, please clarify the # of teeth to be extracted. - If the patient is currently at the dentist's office, call Pre-Op Callback Staff (MA/nurse) to input urgent request.  - If the patient is not currently in the dentist office, please route to the Pre-Op pool.  Request for surgical clearance:  What type of surgery is being performed?  LEFT CTR / LEFT INDEX TRIGGER RELEASE    When is this surgery scheduled?  09/16/2021 AND ON A CANCELLATION LIST FOR A SOONER DATE  What type of clearance is required (medical clearance vs. Pharmacy clearance to hold med vs. Both)?  BOTH  Are there any medications that need to be held prior to surgery and how long?  ASPIRIN  Practice name and name of physician performing surgery?  [facility] / DR. Fredna Dow  What is the office phone number?  [phone]   7.   What is the office fax number?  [phone]  8.   Anesthesia type (None, local, MAC, general) ?  CHOICE    Kendra Moreno 08/27/2021, 10:54 AM  _________________________________________________________________   (provider comments below)

## 2021-08-28 NOTE — Telephone Encounter (Signed)
Pt has been scheduled to see Tereso Newcomer, [facility] 09/09/21 for pre op clearance. I will forward notes to [facility] for appt. Will send FYI to requesting office pt has appt 09/09/21.

## 2021-08-28 NOTE — Telephone Encounter (Signed)
   Name: Kendra Moreno  DOB: 03-24-1976  MRN: [id number]  Primary Cardiologist: Donato Schultz, MD  Chart reviewed as part of pre-operative protocol coverage. Patient has not been seen in over 1 year (since 07/2020). Therefore, she will require a follow-up visit in order to better assess preoperative cardiovascular risk.  Pre-op covering staff: - Please schedule appointment and call patient to inform them. If patient already had an upcoming appointment within acceptable timeframe, please add "pre-op clearance" to the appointment notes so provider is aware. - Please contact requesting surgeon's office via preferred method (i.e, phone, fax) to inform them of need for appointment prior to surgery.  Of note, surgeon's office is asking for recommendations on holding Aspirin. I do not see Aspirin under patient's home medications but it is mentioned in last office visit note. She only had minimal non-obstructive disease on coronary CTA in 08/2020 so should be OK to hold Aspirin if needed for procedure.   I will remove from pre-op pool.   Corrin Parker, PA-C  08/28/2021, 8:12 AM '

## 2021-09-05 ENCOUNTER — Encounter (HOSPITAL_BASED_OUTPATIENT_CLINIC_OR_DEPARTMENT_OTHER): Payer: Self-pay | Admitting: Orthopedic Surgery

## 2021-09-05 ENCOUNTER — Other Ambulatory Visit: Payer: Self-pay

## 2021-09-08 NOTE — Progress Notes (Addendum)
Office Visit    Patient Name: Kendra Moreno Date of Encounter: 09/09/2021  PCP:  [facility]   [facility]  Cardiologist:  Donato Schultz, MD  Advanced Practice Provider:  No care team member to display Electrophysiologist:  None    Chief Complaint    Kendra Moreno is a 45 y.o. female with a hx of unstable angina, COPD, heart block, coronary artery disease, history of tobacco use, presents today for her annual follow-up visit.  Past Medical History    Past Medical History:  Diagnosis Date   Anxiety    CAD (coronary artery disease)    Cigarette smoker    COPD (chronic obstructive pulmonary disease) (HCC)    EKG abnormalities    Heart block    Hypercholesteremia    Seizures (HCC)    Past Surgical History:  Procedure Laterality Date   ACNE CYST REMOVAL     CESAREAN SECTION      Allergies  Allergies  Allergen Reactions   Penicillins Anaphylaxis, Other (See Comments) and Hives   Sulfa Antibiotics Anaphylaxis and Other (See Comments)   Other Other (See Comments)    ALL TREE NUTS, can eat peanuts  ALL TREE NUTS, can eat peanuts ALL TREE NUTS, can eat peanuts   Apple Itching and Rash    History of Present Illness    Kendra Moreno is a 45 y.o. female with a hx of hx of unstable angina, COPD, heart block, coronary artery disease, history of tobacco use, presents today for her annual follow-up visit.  last seen October 2021 by Dr. Anne Fu.  At that time, she was having frequent chest pain as well as left arm pain.  She endorses being tired in the evenings and her pain sometimes radiates to her shoulders.  She was very anxious about this chest pain.  She had an EKG a few months prior which showed sinus rhythm with T wave inversions noted in leads V2 and V3, nonspecific as well as a heart rate of 48 bpm.  Since she had an LAD calcification noted on prior CT scan and abnormal EKG showing T wave inversion in the anterior leads with  ongoing chest discomfort it was decided that she would proceed with a coronary CT scan.  She was also started on Crestor 10 mg daily with a plan to increase to 20 mg daily.  She did have a coronary calcium score of 64 (proximal LAD).  This was 43 percentile for age and sex.  The results were shared with the patient and statin optimization was therapy of choice.  Today she endorses intermittent chest pain that comes and goes.  It lasts only a few seconds.  There are no other associated symptoms or shortness of breath or pain radiating to her left arm or jaw.  She states that it is not associated with exertion but comes on randomly.  She also has frequent palpitations where she feels her heart is racing.  This also comes on randomly.  She still continues to smoke.  She has been diagnosed in the past of COPD however she is not currently on any inhaled medications.  I recommended her following up with her primary care provider for more thorough work-up.  We have provided her with nicotine patches today and I have recommended a ZIO monitor for her palpitations.  She is undergoing carpal tunnel surgery on 09/16/2021 and we discussed cardiac clearance.  She is at low risk for any cardiac  complications for this procedure.  Reports no shortness of breath nor dyspnea on exertion. No edema, orthopnea, PND. Reports no palpitations.     EKGs/Labs/Other Studies Reviewed:   The following studies were reviewed today:  Coronary CT performed 08/22/2020  IMPRESSION: 1. Coronary calcium score of 64 (proximal LAD). This was 38 percentile for age and sex matched control.   2. Normal coronary origin with right dominance.   3. Proximal LAD focal calcification 0-24% stenosis, non flow limiting.   4. CAD-RADS 1. Minimal non-obstructive CAD (0-24%). Consider non-atherosclerotic causes of chest pain. Consider preventive therapy and risk factor modification.  EKG:  EKG is  ordered today.  The ekg ordered today  demonstrates NSR, rate 84, no PVCs noted.   Recent Labs: 06/04/2021: ALT 14; BUN 8; Creatinine, Ser 0.87; Hemoglobin 13.8; Platelets 300; Potassium 4.6; Sodium 142; TSH 1.400  Recent Lipid Panel No results found for: CHOL, TRIG, HDL, CHOLHDL, VLDL, LDLCALC, LDLDIRECT  Home Medications   Current Meds  Medication Sig   diazepam (VALIUM) 2 MG tablet Take 2 mg by mouth daily as needed for anxiety.   hydrOXYzine (ATARAX/VISTARIL) 10 MG tablet Take 10 mg by mouth 4 (four) times daily as needed for anxiety.   meloxicam (MOBIC) 15 MG tablet Take by mouth.   traMADol (ULTRAM) 50 MG tablet Take 50 mg by mouth every 6 (six) hours as needed for moderate pain.     Review of Systems     All other systems reviewed and are otherwise negative except as noted above.  Physical Exam    VS:  BP 102/60   Pulse 84   Ht 5\' 6"  (1.676 m)   Wt 124 lb (56.2 kg)   SpO2 96%   BMI 20.01 kg/m  , BMI Body mass index is 20.01 kg/m.  Wt Readings from Last 3 Encounters:  09/09/21 124 lb (56.2 kg)  06/04/21 127 lb 8 oz (57.8 kg)  08/08/20 132 lb (59.9 kg)     GEN: Well nourished, well developed, in no acute distress. HEENT: normal. Cardiac: RRR, no murmurs, rubs, or gallops. No clubbing, cyanosis, edema.   Respiratory:  Respirations regular and unlabored, clear to auscultation bilaterally. GI: Soft, nontender, nondistended. MS: No deformity or atrophy. Skin: Warm and dry, no rash. Neuro:  Strength and sensation are intact. Psych: Normal affect.  Assessment & Plan    CAD specifically in the LAD with calcium score of 64-optimization of statin therapy.  Recent labs on 06/04/2021 showed normal LFTs. No recent LFTs. Will order these today.   Chest pain- Chest pain for a few seconds and then spontaneous resolves. She has not needed any nitro. No other associated symptoms. It comes on randomly.   Smoker-cessation advised, nicotine patches supplied at this appointment.   COPD-she is currently not on any  medication for this. She wil need to f/u with her PCP.   Palpitations- she has never had a formal workup. Would recommend a zio patch for monitoring over 14 days. She can get this placed after 11/28 when she is having her carpel tunnel surgery.    Disposition: Follow up in 3 month(s) with 12/28, MD or APP.   Click Here to Calculate RCRI      :Donato Schultz   Kendra Moreno's perioperative risk of a major cardiac event is 0.4% according to the Revised Cardiac Risk Index (RCRI).  Therefore, she is at low risk for perioperative complications.    Recommendations: According to ACC/AHA guidelines, no further cardiovascular testing needed.  The patient may proceed to surgery at acceptable risk.   Antiplatelet and/or Anticoagulation Recommendations: She is not on ASA but if she were it would be okay to hold for 7 days.    Signed, Sharlene Dory, PA-C 09/09/2021, 9:43 AM [facility]

## 2021-09-09 ENCOUNTER — Ambulatory Visit (INDEPENDENT_AMBULATORY_CARE_PROVIDER_SITE_OTHER): Payer: Self-pay | Admitting: Physician Assistant

## 2021-09-09 ENCOUNTER — Other Ambulatory Visit: Payer: Self-pay

## 2021-09-09 ENCOUNTER — Other Ambulatory Visit: Payer: Self-pay | Admitting: Physician Assistant

## 2021-09-09 ENCOUNTER — Encounter: Payer: Self-pay | Admitting: Physician Assistant

## 2021-09-09 ENCOUNTER — Encounter: Payer: Self-pay | Admitting: *Deleted

## 2021-09-09 ENCOUNTER — Ambulatory Visit (INDEPENDENT_AMBULATORY_CARE_PROVIDER_SITE_OTHER): Payer: Medicaid Other

## 2021-09-09 VITALS — BP 102/60 | HR 84 | Ht 66.0 in | Wt 124.0 lb

## 2021-09-09 DIAGNOSIS — R001 Bradycardia, unspecified: Secondary | ICD-10-CM

## 2021-09-09 DIAGNOSIS — J449 Chronic obstructive pulmonary disease, unspecified: Secondary | ICD-10-CM

## 2021-09-09 DIAGNOSIS — Z720 Tobacco use: Secondary | ICD-10-CM

## 2021-09-09 DIAGNOSIS — I2510 Atherosclerotic heart disease of native coronary artery without angina pectoris: Secondary | ICD-10-CM

## 2021-09-09 DIAGNOSIS — I200 Unstable angina: Secondary | ICD-10-CM

## 2021-09-09 DIAGNOSIS — R002 Palpitations: Secondary | ICD-10-CM

## 2021-09-09 MED ORDER — NICOTINE 14 MG/24HR TD PT24: 14.0000 mg | MEDICATED_PATCH | Freq: Every day | TRANSDERMAL | 2 refills | Status: DC

## 2021-09-09 NOTE — Patient Instructions (Signed)
Medication Instructions:   START nicotine patch.  One patch daily.  *If you need a refill on your cardiac medications before your next appointment, please call your pharmacy*   Lab Work: Your physician recommends that you return for a FASTING lipid profile ON Monday, December 5. You can come on the day of appointment between 7:30-4:30 fasting from midnight the night before.    If you have labs (blood work) drawn today and your tests are completely normal, you will receive your results only by: MyChart Message (if you have MyChart) OR A paper copy in the mail If you have any lab test that is abnormal or we need to change your treatment, we will call you to review the results.   Testing/Procedures: A zio monitor was ordered today. It will remain on for 14 days. You will then return monitor and event diary in provided box. It takes 1-2 weeks for report to be downloaded and returned to [place]. We will call you with the results. If monitor falls off or has orange flashing light, please call Zio for further instructions.   ZIO XT- Long Term Monitor Instructions  Your physician has requested you wear a ZIO patch monitor for 14 days.  This is a single patch monitor. [employer] supplies one patch monitor per enrollment. Additional stickers are not available. Please do not apply patch if you will be having a Nuclear Stress Test,  Echocardiogram, Cardiac CT, MRI, or Chest Xray during the period you would be wearing the  monitor. The patch cannot be worn during these tests. You cannot remove and re-apply the  ZIO XT patch monitor.  Your ZIO patch monitor will be mailed 3 day USPS to your address on file. It may take 3-5 days  to receive your monitor after you have been enrolled.  Once you have received your monitor, please review the enclosed instructions. Your monitor  has already been registered assigning a specific monitor serial # to you.  Billing and Patient Assistance Program Information  We  have supplied [employer] with any of your insurance information on file for billing purposes. [employer] offers a sliding scale Patient Assistance Program for patients that do not have  insurance, or whose insurance does not completely cover the cost of the ZIO monitor.  You must apply for the Patient Assistance Program to qualify for this discounted rate.  To apply, please call [employer] at [phone], select option 4, select option 2, ask to apply for  Patient Assistance Program. Meredeth Ide will ask your household income, and how many people  are in your household. They will quote your out-of-pocket cost based on that information.  [employer] will also be able to set up a 11-month, interest-free payment plan if needed.  Applying the monitor   Shave hair from upper left chest.  Hold abrader disc by orange tab. Rub abrader in 40 strokes over the upper left chest as  indicated in your monitor instructions.  Clean area with 4 enclosed alcohol pads. Let dry.  Apply patch as indicated in monitor instructions. Patch will be placed under collarbone on left  side of chest with arrow pointing upward.  Rub patch adhesive wings for 2 minutes. Remove white label marked "1". Remove the white  label marked "2". Rub patch adhesive wings for 2 additional minutes.  While looking in a mirror, press and release button in center of patch. A small green light will  flash 3-4 times. This will be your only indicator that the monitor has been  turned on.  Do not shower for the first 24 hours. You may shower after the first 24 hours.  Press the button if you feel a symptom. You will hear a small click. Record Date, Time and  Symptom in the Patient Logbook.  When you are ready to remove the patch, follow instructions on the last 2 pages of Patient  Logbook. Stick patch monitor onto the last page of Patient Logbook.  Place Patient Logbook in the blue and white box. Use locking tab on box and tape box closed  securely. The blue  and white box has prepaid postage on it. Please place it in the mailbox as  soon as possible. Your physician should have your test results approximately 7 days after the  monitor has been mailed back to [facility].  Call [facility] Customer Care at [phone] if you have questions regarding  your ZIO XT patch monitor. Call them immediately if you see an orange light blinking on your  monitor.  If your monitor falls off in less than 4 days, contact our Monitor department at [phone].  If your monitor becomes loose or falls off after 4 days call [employer] at [phone] for  suggestions on securing your monitor     Follow-Up: At [facility], you and your health needs are our priority.  As part of our continuing mission to provide you with exceptional heart care, we have created designated Provider Care Teams.  These Care Teams include your primary Cardiologist (physician) and Advanced Practice Providers (APPs -  Physician Assistants and Nurse Practitioners) who all work together to provide you with the care you need, when you need it.  We recommend signing up for the patient portal called "MyChart".  Sign up information is provided on this After Visit Summary.  MyChart is used to connect with patients for Virtual Visits (Telemedicine).  Patients are able to view lab/test results, encounter notes, upcoming appointments, etc.  Non-urgent messages can be sent to your provider as well.   To learn more about what you can do with MyChart, go to [email or web address].    Your next appointment:   3 month(s)  The format for your next appointment:   In Person  Provider:   Donato Schultz, MD      Other Instructions  You are scheduled for the Lipid Clinic to see the pharmacist.

## 2021-09-09 NOTE — Progress Notes (Signed)

## 2021-09-09 NOTE — Progress Notes (Unsigned)
Enrolled for [employer] to mail a ZIO XT long term holter monitor to the patients address on file.   Patient to start monitor after her surgery on 09/16/21.  Letter with instructions mailed to patient.  Dr. Anne Fu to read.

## 2021-09-16 ENCOUNTER — Ambulatory Visit (HOSPITAL_BASED_OUTPATIENT_CLINIC_OR_DEPARTMENT_OTHER): Payer: Medicaid Other | Admitting: Anesthesiology

## 2021-09-16 ENCOUNTER — Other Ambulatory Visit: Payer: Self-pay

## 2021-09-16 ENCOUNTER — Ambulatory Visit (HOSPITAL_BASED_OUTPATIENT_CLINIC_OR_DEPARTMENT_OTHER)
Admission: RE | Admit: 2021-09-16 | Discharge: 2021-09-16 | Disposition: A | Discharge status: Home / Self Care | Payer: Medicaid Other | Attending: Orthopedic Surgery | Admitting: Orthopedic Surgery

## 2021-09-16 ENCOUNTER — Encounter (HOSPITAL_BASED_OUTPATIENT_CLINIC_OR_DEPARTMENT_OTHER)
Admission: RE | Disposition: A | Discharge status: Home / Self Care | Payer: Self-pay | Source: Home / Self Care | Attending: Orthopedic Surgery

## 2021-09-16 ENCOUNTER — Encounter (HOSPITAL_BASED_OUTPATIENT_CLINIC_OR_DEPARTMENT_OTHER): Payer: Self-pay | Admitting: Orthopedic Surgery

## 2021-09-16 DIAGNOSIS — M65322 Trigger finger, left index finger: Secondary | ICD-10-CM | POA: Insufficient documentation

## 2021-09-16 DIAGNOSIS — G5602 Carpal tunnel syndrome, left upper limb: Secondary | ICD-10-CM | POA: Diagnosis not present

## 2021-09-16 DIAGNOSIS — F17200 Nicotine dependence, unspecified, uncomplicated: Secondary | ICD-10-CM | POA: Insufficient documentation

## 2021-09-16 HISTORY — PX: TRIGGER FINGER RELEASE: SHX641

## 2021-09-16 HISTORY — PX: CARPAL TUNNEL RELEASE: SHX101

## 2021-09-16 HISTORY — DX: Methicillin resistant Staphylococcus aureus infection, unspecified site: A49.02

## 2021-09-16 LAB — POCT PREGNANCY, URINE: Preg Test, Ur: NEGATIVE

## 2021-09-16 SURGERY — CARPAL TUNNEL RELEASE
Anesthesia: General | Site: Wrist | Laterality: Left

## 2021-09-16 MED ORDER — FENTANYL CITRATE (PF) 100 MCG/2ML IJ SOLN
INTRAMUSCULAR | Status: AC
  Filled 2021-09-16: qty 2

## 2021-09-16 MED ORDER — LIDOCAINE HCL (CARDIAC) PF 100 MG/5ML IV SOSY
PREFILLED_SYRINGE | INTRAVENOUS | Status: DC | PRN
  Administered 2021-09-16: 40 mg via INTRAVENOUS

## 2021-09-16 MED ORDER — ONDANSETRON HCL 4 MG/2ML IJ SOLN
INTRAMUSCULAR | Status: AC
  Filled 2021-09-16: qty 2

## 2021-09-16 MED ORDER — PROPOFOL 10 MG/ML IV BOLUS
INTRAVENOUS | Status: DC | PRN
  Administered 2021-09-16: 150 mg via INTRAVENOUS

## 2021-09-16 MED ORDER — MIDAZOLAM HCL 5 MG/5ML IJ SOLN
INTRAMUSCULAR | Status: DC | PRN
  Administered 2021-09-16: 2 mg via INTRAVENOUS

## 2021-09-16 MED ORDER — AMISULPRIDE (ANTIEMETIC) 5 MG/2ML IV SOLN: 10.0000 mg | Freq: Once | INTRAVENOUS | Status: DC | PRN

## 2021-09-16 MED ORDER — OXYCODONE HCL 5 MG/5ML PO SOLN: 5.0000 mg | Freq: Once | ORAL | Status: DC | PRN

## 2021-09-16 MED ORDER — DEXAMETHASONE SODIUM PHOSPHATE 10 MG/ML IJ SOLN
INTRAMUSCULAR | Status: AC
  Filled 2021-09-16: qty 1

## 2021-09-16 MED ORDER — HYDROMORPHONE HCL 1 MG/ML IJ SOLN
0.2500 mg | INTRAMUSCULAR | Status: DC | PRN
  Administered 2021-09-16: 16:00:00 0.5 mg via INTRAVENOUS

## 2021-09-16 MED ORDER — MIDAZOLAM HCL 2 MG/2ML IJ SOLN
INTRAMUSCULAR | Status: AC
  Filled 2021-09-16: qty 2

## 2021-09-16 MED ORDER — BUPIVACAINE HCL (PF) 0.25 % IJ SOLN
INTRAMUSCULAR | Status: DC | PRN
  Administered 2021-09-16: 10 mL

## 2021-09-16 MED ORDER — FENTANYL CITRATE (PF) 100 MCG/2ML IJ SOLN
INTRAMUSCULAR | Status: DC | PRN
  Administered 2021-09-16 (×2): 50 ug via INTRAVENOUS

## 2021-09-16 MED ORDER — ONDANSETRON HCL 4 MG/2ML IJ SOLN
INTRAMUSCULAR | Status: DC | PRN
  Administered 2021-09-16: 4 mg via INTRAVENOUS

## 2021-09-16 MED ORDER — OXYCODONE HCL 5 MG PO TABS: 5.0000 mg | ORAL_TABLET | Freq: Once | ORAL | Status: DC | PRN

## 2021-09-16 MED ORDER — HYDROMORPHONE HCL 1 MG/ML IJ SOLN
INTRAMUSCULAR | Status: AC
  Filled 2021-09-16: qty 0.5

## 2021-09-16 MED ORDER — EPHEDRINE SULFATE 50 MG/ML IJ SOLN
INTRAMUSCULAR | Status: DC | PRN
  Administered 2021-09-16: 10 mg via INTRAVENOUS

## 2021-09-16 MED ORDER — DEXAMETHASONE SODIUM PHOSPHATE 10 MG/ML IJ SOLN
INTRAMUSCULAR | Status: DC | PRN
Start: 2021-09-16 — End: 2021-09-16
  Administered 2021-09-16: 10 mg via INTRAVENOUS

## 2021-09-16 MED ORDER — LACTATED RINGERS IV SOLN: INTRAVENOUS | Status: DC

## 2021-09-16 MED ORDER — PROMETHAZINE HCL 25 MG/ML IJ SOLN: 6.2500 mg | INTRAMUSCULAR | Status: DC | PRN

## 2021-09-16 MED ORDER — HYDROCODONE-ACETAMINOPHEN 5-325 MG PO TABS: ORAL_TABLET | ORAL | 0 refills | Status: DC

## 2021-09-16 MED ORDER — MEPERIDINE HCL 25 MG/ML IJ SOLN: 6.2500 mg | INTRAMUSCULAR | Status: DC | PRN

## 2021-09-16 MED ORDER — 0.9 % SODIUM CHLORIDE (POUR BTL) OPTIME
TOPICAL | Status: DC | PRN
  Administered 2021-09-16: 15:00:00 100 mL

## 2021-09-16 MED ORDER — CLINDAMYCIN PHOSPHATE 900 MG/50ML IV SOLN
INTRAVENOUS | Status: AC
  Filled 2021-09-16: qty 50

## 2021-09-16 MED ORDER — CLINDAMYCIN PHOSPHATE 900 MG/50ML IV SOLN
900.0000 mg | INTRAVENOUS | Status: AC
  Administered 2021-09-16: 15:00:00 900 mg via INTRAVENOUS

## 2021-09-16 SURGICAL SUPPLY — 39 items
APL PRP STRL LF DISP 70% ISPRP (MISCELLANEOUS) ×2
BLADE SURG 15 STRL LF DISP TIS (BLADE) ×4 IMPLANT
BLADE SURG 15 STRL SS (BLADE) ×6
BNDG CMPR 5X2 CHSV 1 LYR STRL (GAUZE/BANDAGES/DRESSINGS) ×2
BNDG CMPR 9X4 STRL LF SNTH (GAUZE/BANDAGES/DRESSINGS) ×2
BNDG COHESIVE 2X5 TAN ST LF (GAUZE/BANDAGES/DRESSINGS) ×3 IMPLANT
BNDG ELASTIC 3X5.8 VLCR STR LF (GAUZE/BANDAGES/DRESSINGS) ×3 IMPLANT
BNDG ESMARK 4X9 LF (GAUZE/BANDAGES/DRESSINGS) ×1 IMPLANT
BNDG GAUZE ELAST 4 BULKY (GAUZE/BANDAGES/DRESSINGS) ×3 IMPLANT
CHLORAPREP W/TINT 26 (MISCELLANEOUS) ×3 IMPLANT
CORD BIPOLAR FORCEPS 12FT (ELECTRODE) ×3 IMPLANT
COVER BACK TABLE 60X90IN (DRAPES) ×3 IMPLANT
COVER MAYO STAND STRL (DRAPES) ×3 IMPLANT
CUFF TOURN SGL QUICK 18X4 (TOURNIQUET CUFF) ×2 IMPLANT
DRAPE EXTREMITY T 121X128X90 (DISPOSABLE) ×3 IMPLANT
DRAPE SURG 17X23 STRL (DRAPES) ×3 IMPLANT
DRSG PAD ABDOMINAL 8X10 ST (GAUZE/BANDAGES/DRESSINGS) ×3 IMPLANT
GAUZE SPONGE 4X4 12PLY STRL (GAUZE/BANDAGES/DRESSINGS) ×3 IMPLANT
GAUZE XEROFORM 1X8 LF (GAUZE/BANDAGES/DRESSINGS) ×3 IMPLANT
GLOVE SRG 8 PF TXTR STRL LF DI (GLOVE) ×2 IMPLANT
GLOVE SURG ENC MOIS LTX SZ7.5 (GLOVE) ×3 IMPLANT
GLOVE SURG POLYISO LF SZ6.5 (GLOVE) ×1 IMPLANT
GLOVE SURG UNDER POLY LF SZ7 (GLOVE) ×2 IMPLANT
GLOVE SURG UNDER POLY LF SZ8 (GLOVE) ×3
GOWN STRL REUS W/ TWL LRG LVL3 (GOWN DISPOSABLE) ×2 IMPLANT
GOWN STRL REUS W/TWL LRG LVL3 (GOWN DISPOSABLE) ×3
GOWN STRL REUS W/TWL XL LVL3 (GOWN DISPOSABLE) ×3 IMPLANT
NDL HYPO 25X1 1.5 SAFETY (NEEDLE) ×2 IMPLANT
NEEDLE HYPO 25X1 1.5 SAFETY (NEEDLE) ×3 IMPLANT
NS IRRIG 1000ML POUR BTL (IV SOLUTION) ×3 IMPLANT
PACK BASIN DAY SURGERY FS (CUSTOM PROCEDURE TRAY) ×3 IMPLANT
PADDING CAST ABS 4INX4YD NS (CAST SUPPLIES) ×1
PADDING CAST ABS COTTON 4X4 ST (CAST SUPPLIES) ×2 IMPLANT
STOCKINETTE 4X48 STRL (DRAPES) ×3 IMPLANT
SUT ETHILON 4 0 PS 2 18 (SUTURE) ×3 IMPLANT
SYR BULB EAR ULCER 3OZ GRN STR (SYRINGE) ×3 IMPLANT
SYR CONTROL 10ML LL (SYRINGE) ×3 IMPLANT
TOWEL GREEN STERILE FF (TOWEL DISPOSABLE) ×6 IMPLANT
UNDERPAD 30X36 HEAVY ABSORB (UNDERPADS AND DIAPERS) ×3 IMPLANT

## 2021-09-16 NOTE — Anesthesia Procedure Notes (Signed)
Procedure Name: LMA Insertion Date/Time: 09/16/2021 2:51 PM Performed by: Burna Cash, CRNA Pre-anesthesia Checklist: Patient identified, Emergency Drugs available, Suction available and Patient being monitored Patient Re-evaluated:Patient Re-evaluated prior to induction Oxygen Delivery Method: Circle system utilized Preoxygenation: Pre-oxygenation with 100% oxygen Induction Type: IV induction Ventilation: Mask ventilation without difficulty LMA: LMA inserted LMA Size: 4.0 Number of attempts: 1 Airway Equipment and Method: Bite block Placement Confirmation: positive ETCO2 Tube secured with: Tape Dental Injury: Teeth and Oropharynx as per pre-operative assessment

## 2021-09-16 NOTE — Transfer of Care (Signed)
Immediate Anesthesia Transfer of Care Note  Patient: Kendra Moreno  Procedure(s) Performed: LEFT CARPAL TUNNEL RELEASE (Left: Wrist) RELEASE TRIGGER FINGER/A-1 PULLEY LEFT INDEX FINGER (Left: Hand)  Patient Location: PACU  Anesthesia Type:General  Level of Consciousness: awake, alert  and oriented  Airway & Oxygen Therapy: Patient Spontanous Breathing and Patient connected to face mask oxygen  Post-op Assessment: Report given to RN and Post -op Vital signs reviewed and stable  Post vital signs: Reviewed and stable  Last Vitals:  Vitals Value Taken Time  BP    Temp    Pulse 96 09/16/21 1531  Resp 24 09/16/21 1531  SpO2 97 % 09/16/21 1531  Vitals shown include unvalidated device data.  Last Pain:  Vitals:   09/16/21 1247  TempSrc: Oral  PainSc: 0-No pain      Patients Stated Pain Goal: 4 (09/16/21 1247)  Complications: No notable events documented.

## 2021-09-16 NOTE — Op Note (Signed)
09/16/2021 [facility]                              OPERATIVE REPORT   PREOPERATIVE DIAGNOSIS:   Left carpal tunnel syndrome Left index trigger digit  POSTOPERATIVE DIAGNOSIS:   Left carpal tunnel syndrome Left index trigger digit  PROCEDURE:   Left carpal tunnel release Left index trigger digit release  SURGEON:  Betha Loa, MD  ASSISTANT:  none.  ANESTHESIA: General  IV FLUIDS:  Per anesthesia flow sheet.  ESTIMATED BLOOD LOSS:  Minimal.  COMPLICATIONS:  None.  SPECIMENS:  None.  TOURNIQUET TIME:    Total Tourniquet Time Documented: Upper Arm (Left) - 18 minutes Total: Upper Arm (Left) - 18 minutes   DISPOSITION:  Stable to PACU.  LOCATION: [facility]  INDICATIONS:  45 yo female with numbness and tingling left hand and triggering left index finger.  Nocturnal symptoms.  Positive nerve conduction studies.  She wishes to have a carpal tunnel release and left index trigger release for management of her symptoms.  Risks, benefits and alternatives of surgery were discussed including the risk of blood loss; infection; damage to nerves, vessels, tendons, ligaments, bone; failure of surgery; need for additional surgery; complications with wound healing; continued pain; recurrence of carpal tunnel syndrome; and damage to motor branch. She voiced understanding of these risks and elected to proceed.   OPERATIVE COURSE:  After being identified preoperatively by myself, the patient and I agreed upon the procedure and site of procedure.  The surgical site was marked.  Surgical consent had been signed.  She was given IV clindamycin as preoperative antibiotic prophylaxis.  She was transferred to the operating room and placed on the operating room table in supine position with the left upper extremity on an armboard.  General anesthesia was induced by the anesthesiologist.  Left upper extremity was prepped and draped in normal sterile orthopaedic fashion.   A surgical pause was performed between the surgeons, anesthesia, and operating room staff, and all were in agreement as to the patient, procedure, and site of procedure.  Tourniquet at the proximal aspect of the extremity was inflated to 250 mmHg after exsanguination of the arm with an Esmarch bandage  An incision was made at the volar aspect of the MP joint of the index finger.  This was carried into the subcutaneous tissues by spreading technique.  Bipolar electrocautery was used to obtain hemostasis.  The radial and ulnar digital nerves were protected throughout the case. The flexor sheath was identified.  The A1 pulley was identified and sharply incised.  It was released in its entirety.  It was thickened.  The proximal 1-2 mm of the A2 pulley was vented to allow better excursion of the tendons.  The finger was placed through a range of motion and there was noted to be no catching.  The tendons were brought through the wound and any adherences released.  The wound was then copiously irrigated with sterile saline. It was closed with 4-0 nylon in a horizontal mattress fashion. A separate incision was made over the transverse carpal ligament and carried into the subcutaneous tissues by spreading technique.  Bipolar electrocautery was used to obtain hemostasis.  The palmar fascia was sharply incised.  The transverse carpal ligament was identified and sharply incised.  It was incised distally first.  The flexor tendons were identified.  The flexor tendon to the ring finger was identified and  retracted radially.  The transverse carpal ligament was then incised proximally.  Scissors were used to split the distal aspect of the volar antebrachial fascia.  A finger was placed into the wound to ensure complete decompression, which was the case.  The nerve was examined.  It was adherent to the radial leaflet.  The motor branch was identified and was intact.  The wound was copiously irrigated with sterile saline.  It was  then closed with 4-0 nylon in a horizontal mattress fashion.  The wounds were injected with 0.25% plain Marcaine to aid in postoperative analgesia.  They were dressed with sterile Xeroform, 4x4s, an ABD, and wrapped with Kerlix and an Ace bandage.  Tourniquet was deflated at 18 minutes.  Fingertips were pink with brisk capillary refill after deflation of the tourniquet.  Operative drapes were broken down.  The patient was awoken from anesthesia safely.  She was transferred back to stretcher and taken to the PACU in stable condition.  I will see her back in the office in 1 week for postoperative followup.  I will give her a prescription for Norco 5/325 1-2 tabs PO q6 hours prn pain, dispense # 15.    Betha Loa, MD Electronically signed, 09/16/21

## 2021-09-16 NOTE — Discharge Instructions (Addendum)

## 2021-09-16 NOTE — H&P (Signed)
Kendra Moreno is an 45 y.o. female.   Chief Complaint: carpal tunnel syndrome, trigger digit HPI: 45 yo female with numbness and tingling left hand.  Nocturnal symptoms.  Positive nerve conduction studies.  She wishes to have left carpal tunnel release and index finger trigger release.  She wishes to have this released as well.  Allergies:  Allergies  Allergen Reactions   Penicillins Anaphylaxis, Other (See Comments) and Hives   Sulfa Antibiotics Anaphylaxis and Other (See Comments)   Other Other (See Comments)    ALL TREE NUTS, can eat peanuts  ALL TREE NUTS, can eat peanuts ALL TREE NUTS, can eat peanuts   Apple Itching and Rash    Past Medical History:  Diagnosis Date   Anxiety    CAD (coronary artery disease)    Cigarette smoker    COPD (chronic obstructive pulmonary disease) (HCC)    EKG abnormalities    Heart block    Hypercholesteremia    MRSA (methicillin resistant Staphylococcus aureus)    Patient states she has had MRSA twice "years ago".   Seizures (HCC)     Past Surgical History:  Procedure Laterality Date   ACNE CYST REMOVAL     CESAREAN SECTION      Family History: Family History  Problem Relation Age of Onset   Diabetes Mother    Colon cancer Mother    COPD Father    Asthma Sister    COPD Sister    Cancer Other     Social History:   reports that she has been smoking cigarettes. She has a 3.00 pack-year smoking history. She has never used smokeless tobacco. She reports that she does not drink alcohol and does not use drugs.  Medications: Medications Prior to Admission  Medication Sig Dispense Refill   diazepam (VALIUM) 2 MG tablet Take 2 mg by mouth daily as needed for anxiety.     hydrOXYzine (ATARAX/VISTARIL) 10 MG tablet Take 10 mg by mouth 4 (four) times daily as needed for anxiety.     meloxicam (MOBIC) 15 MG tablet Take by mouth.     nicotine (NICODERM CQ - DOSED IN MG/24 HOURS) 14 mg/24hr patch Place 1 patch (14 mg total) onto the  skin daily. 28 patch 2   traMADol (ULTRAM) 50 MG tablet Take 50 mg by mouth every 6 (six) hours as needed for moderate pain.      Results for orders placed or performed during the hospital encounter of 09/16/21 (from the past 48 hour(s))  Pregnancy, urine POC     Status: None   Collection Time: 09/16/21 12:30 PM  Result Value Ref Range   Preg Test, Ur NEGATIVE NEGATIVE    Comment:        THE SENSITIVITY OF THIS METHODOLOGY IS >24 mIU/mL     No results found.    Blood pressure (!) 108/50, pulse (!) 57, temperature 97.7 F (36.5 C), temperature source Oral, resp. rate 17, height 5\' 6"  (1.676 m), weight 58 kg, SpO2 100 %, unknown if currently breastfeeding.  General appearance: alert, cooperative, and appears stated age Head: Normocephalic, without obvious abnormality, atraumatic Neck: supple, symmetrical, trachea midline Cardio: regular rate and rhythm Resp: clear to auscultation bilaterally Extremities: Intact sensation and capillary refill all digits.  +epl/fpl/io.  No wounds.  Pulses: 2+ and symmetric Skin: Skin color, texture, turgor normal. No rashes or lesions Neurologic: Grossly normal Incision/Wound: none  Assessment/Plan Left carpal tunnel syndrome and index finger trigger digit.  Non operative and operative  treatment options have been discussed with the patient and patient wishes to proceed with operative treatment. Risks, benefits, and alternatives of surgery have been discussed and the patient agrees with the plan of care.   Betha Loa 09/16/2021, 2:28 PM

## 2021-09-16 NOTE — Anesthesia Postprocedure Evaluation (Signed)
Anesthesia Post Note  Patient: Kendra Moreno  Procedure(s) Performed: LEFT CARPAL TUNNEL RELEASE (Left: Wrist) RELEASE TRIGGER FINGER/A-1 PULLEY LEFT INDEX FINGER (Left: Hand)     Patient location during evaluation: PACU Anesthesia Type: General Level of consciousness: awake and alert Pain management: pain level controlled Vital Signs Assessment: post-procedure vital signs reviewed and stable Respiratory status: spontaneous breathing, nonlabored ventilation and respiratory function stable Cardiovascular status: blood pressure returned to baseline and stable Postop Assessment: no apparent nausea or vomiting Anesthetic complications: no   No notable events documented.  Last Vitals:  Vitals:   09/16/21 1530 09/16/21 1545  BP: 111/74 (!) 104/56  Pulse: 95 81  Resp: (!) 23 (!) 23  Temp:    SpO2: 97% 95%    Last Pain:  Vitals:   09/16/21 1545  TempSrc:   PainSc: 7                  Lowella Curb

## 2021-09-16 NOTE — Anesthesia Preprocedure Evaluation (Signed)
Anesthesia Evaluation  Patient identified by MRN, date of birth, ID band Patient awake    Reviewed: Allergy & Precautions, H&P , NPO status , Patient's Chart, lab work & pertinent test results  Airway Mallampati: II  TM Distance: >3 FB Neck ROM: Full    Dental no notable dental hx.    Pulmonary COPD, Current Smoker and Patient abstained from smoking.,    Pulmonary exam normal breath sounds clear to auscultation       Cardiovascular + CAD  Normal cardiovascular exam+ dysrhythmias  Rhythm:Regular Rate:Normal     Neuro/Psych Seizures -,  Anxiety negative psych ROS   GI/Hepatic negative GI ROS, Neg liver ROS,   Endo/Other  negative endocrine ROS  Renal/GU negative Renal ROS  negative genitourinary   Musculoskeletal negative musculoskeletal ROS (+)   Abdominal   Peds negative pediatric ROS (+)  Hematology negative hematology ROS (+)   Anesthesia Other Findings   Reproductive/Obstetrics negative OB ROS                             Anesthesia Physical Anesthesia Plan  ASA: 3  Anesthesia Plan: General   Post-op Pain Management:    Induction: Intravenous  PONV Risk Score and Plan: 2 and Ondansetron, Midazolam and Treatment may vary due to age or medical condition  Airway Management Planned: LMA  Additional Equipment:   Intra-op Plan:   Post-operative Plan: Extubation in OR  Informed Consent: I have reviewed the patients History and Physical, chart, labs and discussed the procedure including the risks, benefits and alternatives for the proposed anesthesia with the patient or authorized representative who has indicated his/her understanding and acceptance.     Dental advisory given  Plan Discussed with: CRNA  Anesthesia Plan Comments:         Anesthesia Quick Evaluation

## 2021-09-17 ENCOUNTER — Encounter (HOSPITAL_BASED_OUTPATIENT_CLINIC_OR_DEPARTMENT_OTHER): Payer: Self-pay | Admitting: Orthopedic Surgery

## 2021-09-22 DIAGNOSIS — R002 Palpitations: Secondary | ICD-10-CM | POA: Diagnosis not present

## 2021-09-23 ENCOUNTER — Encounter (INDEPENDENT_AMBULATORY_CARE_PROVIDER_SITE_OTHER): Payer: Self-pay

## 2021-09-23 ENCOUNTER — Other Ambulatory Visit: Payer: Medicaid Other

## 2021-09-23 ENCOUNTER — Other Ambulatory Visit: Payer: Self-pay

## 2021-10-01 ENCOUNTER — Ambulatory Visit: Payer: Medicaid Other | Admitting: Pharmacist

## 2021-10-01 ENCOUNTER — Other Ambulatory Visit: Payer: Self-pay

## 2021-10-01 ENCOUNTER — Encounter (INDEPENDENT_AMBULATORY_CARE_PROVIDER_SITE_OTHER): Payer: Self-pay

## 2021-10-01 DIAGNOSIS — E785 Hyperlipidemia, unspecified: Secondary | ICD-10-CM | POA: Insufficient documentation

## 2021-10-01 LAB — LIPID PANEL
Chol/HDL Ratio: 2.4 ratio (ref 0.0–4.4)
Cholesterol, Total: 174 mg/dL (ref 100–199)
HDL: 72 mg/dL (ref >39)
LDL Chol Calc (NIH): 88 mg/dL (ref 0–99)
Triglycerides: 74 mg/dL (ref 0–149)
VLDL Cholesterol Cal: 14 mg/dL (ref 5–40)

## 2021-10-01 LAB — HEPATIC FUNCTION PANEL
ALT: 16 IU/L (ref 0–32)
AST: 20 IU/L (ref 0–40)
Albumin: 4.7 g/dL (ref 3.8–4.8)
Alkaline Phosphatase: 82 IU/L (ref 44–121)
Bilirubin Total: 0.9 mg/dL (ref 0.0–1.2)
Bilirubin, Direct: 0.23 mg/dL (ref 0.00–0.40)
Total Protein: 6.7 g/dL (ref 6.0–8.5)

## 2021-10-01 NOTE — Patient Instructions (Addendum)
Decrease intake of sugar drinks and processed foods Try to establish an exercise routine  Tips for living a healthier life     Building a Healthy and Balanced Diet Make most of your meal vegetables and fruits -  of your plate. Aim for color and variety, and remember that potatoes dont count as vegetables on the Healthy Eating Plate because of their negative impact on blood sugar.  Go for whole grains -  of your plate. Whole and intact grains--whole wheat, barley, wheat berries, quinoa, oats, brown rice, and foods made with them, such as whole wheat pasta--have a milder effect on blood sugar and insulin than white bread, white rice, and other refined grains.  Protein power -  of your plate. Fish, poultry, beans, and nuts are all healthy, versatile protein sources--they can be mixed into salads, and pair well with vegetables on a plate. Limit red meat, and avoid processed meats such as bacon and sausage.  Healthy plant oils - in moderation. Choose healthy vegetable oils like olive, canola, soy, corn, sunflower, peanut, and others, and avoid partially hydrogenated oils, which contain unhealthy trans fats. Remember that low-fat does not mean healthy.  Drink water, coffee, or tea. Skip sugary drinks, limit milk and dairy products to one to two servings per day, and limit juice to a small glass per day.  Stay active. The red figure running across the Healthy Eating Plates placemat is a reminder that staying active is also important in weight control.  The main message of the Healthy Eating Plate is to focus on diet quality:  The type of carbohydrate in the diet is more important than the amount of carbohydrate in the diet, because some sources of carbohydrate--like vegetables (other than potatoes), fruits, whole grains, and beans--are healthier than others. The Healthy Eating Plate also advises consumers to avoid sugary beverages, a major source of calories--usually with little  nutritional value--in the American diet. The Healthy Eating Plate encourages consumers to use healthy oils, and it does not set a maximum on the percentage of calories people should get each day from healthy sources of fat. In this way, the Healthy Eating Plate recommends the opposite of the low-fat message promoted for decades by the USDA.  [employer]  SUGAR  Sugar is a huge problem in the modern day diet. Sugar is a big contributor to heart disease, diabetes, high triglyceride levels, fatty liver disease and obesity. Sugar is hidden in almost all packaged foods/beverages. Added sugar is extra sugar that is added beyond what is naturally found and has no nutritional benefit for your body. The American Heart Association recommends limiting added sugars to no more than 25g for women and 36 grams for men per day. There are many names for sugar including maltose, sucrose (names ending in "ose"), high fructose corn syrup, molasses, cane sugar, corn sweetener, raw sugar, syrup, honey or fruit juice concentrate.   One of the best ways to limit your added sugars is to stop drinking sweetened beverages such as soda, sweet tea, and fruit juice.  There is 65g of added sugars in one 20oz bottle of Coke! That is equal to 7.5 donuts.   Pay attention and read all nutrition facts labels. Below is an examples of a nutrition facts label. The #1 is showing you the total sugars where the # 2 is showing you the added sugars. This one serving has almost the max amount of added sugars per day!     20 oz Soda 65g Sugar =  7.5 Glazed Donuts  16oz Energy  Drink 54g Sugar = 6.5 Glazed Donuts  Large Sweet  Tea 38g Sugar = 4 Glazed Donuts  20oz Sports  Drink 34g Sugar = 3.5 Glazed Donuts  8oz Chocolate Milk 24g Sugar =2.5 Glazed Donuts  8oz Orange  Juice 21g Sugar = 2 Glazed Donuts  1 Juice Box 14g Sugar = 1.5 Glazed Donuts  16oz Water= NO  SUGAR!!  EXERCISE  Exercise is good. Weve all heard that. In an ideal world, we would all have time and resources to get plenty of it. When you are active, your heart pumps more efficiently and you will feel better.  Multiple studies show that even walking regularly has benefits that include living a longer life. The American Heart Association recommends 150 minutes per week of exercise (30 minutes per day most days of the week). You can do this in any increment you wish. Nine or more 10-minute walks count. So does an hour-long exercise class. Break the time apart into what will work in your life. Some of the best things you can do include walking briskly, jogging, cycling or swimming laps. Not everyone is ready to exercise. Sometimes we need to start with just getting active. Here are some easy ways to be more active throughout the day:  Take the stairs instead of the elevator  Go for a 10-15 minute walk during your lunch break (find a friend to make it more enjoyable)  When shopping, park at the back of the parking lot  If you take public transportation, get off one stop early and walk the extra distance  Pace around while making phone calls  Check with your doctor if you arent sure what your limitations may be. Always remember to drink plenty of water when doing any type of exercise. Dont feel like a failure if youre not getting the 90-150 minutes per week. If you started by being a couch potato, then just a 10-minute walk each day is a huge improvement. Start with little victories and work your way up.   HEALTHY EATING TIPS  When looking to improve your eating habits, whether to lose weight, lower blood pressure or just be healthier, it helps to know what a serving size is.   Grains 1 slice of bread,  bagel,  cup pasta or rice  Vegetables 1 cup fresh or raw vegetables,  cup cooked or canned Fruits 1 piece of medium sized fruit,  cup canned,   Meats/Proteins  cup dried       1 oz  meat, 1 egg,  cup cooked beans, nuts or seeds  Dairy        Fats Individual yogurt container, 1 cup (8oz)    1 teaspoon margarine/butter or vegetable  milk or milk alternative, 1 slice of cheese          oil; 1 tablespoon mayonnaise or salad dressing                  Plan ahead: make a menu of the meals for a week then create a grocery list to go with that menu. Consider meals that easily stretch into a night of leftovers, such as stews or casseroles. Or consider making two of your favorite meal and put one in the freezer for another night. Try a night or two each week that is meatless or no cook such as salads. When you get home from the grocery store wash and prepare your vegetables and fruits. Then when  you need them they are ready to go.   Tips for going to the grocery store:  Buy store or generic brands  Check the weekly ad from your store on-line or in their in-store flyer  Look at the unit price on the shelf tag to compare/contrast the costs of different items  Buy fruits/vegetables in season  Carrots, bananas and apples are low-cost, naturally healthy items  If meats or frozen vegetables are on sale, buy some extras and put in your freezer  Limit buying prepared or ready to eat items, even if they are pre-made salads or fruit snacks  Do not shop when youre hungry  Foods at eye level tend to be more expensive. Look on the high and low shelves for deals.  Consider shopping at the farmers market for fresh foods in season.  Avoid the cookie and chip aisles (these are expensive, high in calories and low in nutritional value). Shop on the outside of the grocery store.  Healthy food preparations:  If you cant get lean hamburger, be sure to drain the fat when cooking  Steam, saut (in olive oil), grill or bake foods  Experiment with different seasonings to avoid adding salt to your foods. Kosher salt, sea salt and Himalayan salt are all still salt and should be avoided. Try  seasoning food with onion, garlic, thyme, rosemary, basil ect. Onion powder or garlic powder is ok. Avoid if it says salt (ie garlic salt).

## 2021-10-01 NOTE — Progress Notes (Signed)
Patient ID: Kendra Moreno                 DOB: 04-03-1976                    MRN: [id number]     HPI: Kendra Moreno is a 45 y.o. female patient of Dr. Anne Fu' referred to lipid clinic by Jari Favre, PA. PMH is significant for unstable angina, COPD, heart block, coronary artery disease and history of tobacco use. CT showed calcium in the proximal LAD with a CAC of 64 (99th percentile). Her rosuvastatin was increased from 10mg  to 20mg  daily.  Patient presents to lipid clinic today. She brings in her two children. She states that she cant remember what happened when she took rosuvastatin. Thinks she only took for a week and thinks it was muscle aches. Not taking anything currently. No lipid panel available. Will be getting one today. Is active, but no formal exercise. Has been wearing nicotine patches on and off. Did not wear one yesterday and only smoked 1 cigarette.   Current Medications: none Intolerances: rosuvastatin (muscle aches) Risk Factors: smoking, CAD LDL goal: <70  Diet: breakfast: bagel with cream cheese, mini wheat, toast biscuits, sausage, bacon eggs Lunch: taco bell, pizza, sonic Dinner: macaroni and cheese, chicken, salmon, potatoes, broccoli and cheese, lima beans, chili beans Snacks: goldfish, PB crackers, peanuts Drink: water, coffee w/ cream and sugar, sweet tea, mountain dew, chocolate milk  Exercise: walks, active with her children  Family History:  Family History  Problem Relation Age of Onset   Diabetes Mother    Colon cancer Mother    COPD Father    Asthma Sister    COPD Sister    Cancer Other      Social History: cutting back- yesterday had 1 cigarette, 1-2 beers, twice a week   Labs:  Past Medical History:  Diagnosis Date   Anxiety    CAD (coronary artery disease)    Cigarette smoker    COPD (chronic obstructive pulmonary disease) (HCC)    EKG abnormalities    Heart block    Hypercholesteremia    MRSA (methicillin resistant Staphylococcus  aureus)    Patient states she has had MRSA twice "years ago".   Seizures (HCC)     Current Outpatient Medications on File Prior to Visit  Medication Sig Dispense Refill   diazepam (VALIUM) 2 MG tablet Take 2 mg by mouth daily as needed for anxiety.     HYDROcodone-acetaminophen (NORCO/VICODIN) 5-325 MG tablet 1-2 tabs PO q6 hours prn pain 15 tablet 0   hydrOXYzine (ATARAX/VISTARIL) 10 MG tablet Take 10 mg by mouth 4 (four) times daily as needed for anxiety.     meloxicam (MOBIC) 15 MG tablet Take by mouth.     nicotine (NICODERM CQ - DOSED IN MG/24 HOURS) 14 mg/24hr patch Place 1 patch (14 mg total) onto the skin daily. 28 patch 2   No current facility-administered medications on file prior to visit.    Allergies  Allergen Reactions   Penicillins Anaphylaxis, Other (See Comments) and Hives   Sulfa Antibiotics Anaphylaxis and Other (See Comments)   Other Other (See Comments)    ALL TREE NUTS, can eat peanuts  ALL TREE NUTS, can eat peanuts ALL TREE NUTS, can eat peanuts   Apple Itching and Rash    Assessment/Plan:  1. Hyperlipidemia - No LDL available yet. I discussed with patient the pathophysiology of arthrosclerosis and heart attacks. We talked about  how statins work and their potential side effects. She has only tried one statin. Advised that we try another before pursuing PCSK9i. Will await labs to come back and then will decide on statin. Since she has already tired rosuvastatin, may try atorvastatin 20 or 40mg  daily. I will call patient on Thursday with her lab results and plan. I have encouraged her to limit ultra-processed foods, limit sugary drinks and establish an exercise routine. Increase vegetable intake that is not smothered in processed cheese.   Thank you,   Thursday, Pharm.D, BCPS, CPP [facility]  [address] [place], [place]  Phone: [phone]; Fax: [phone]

## 2021-10-03 ENCOUNTER — Telehealth: Payer: Self-pay | Admitting: Pharmacist

## 2021-10-03 MED ORDER — ATORVASTATIN CALCIUM 20 MG PO TABS: 20.0000 mg | ORAL_TABLET | Freq: Every day | ORAL | 3 refills | Status: DC

## 2021-10-03 NOTE — Telephone Encounter (Signed)
Reviewed labs with patient. Goal is LDL<70 due to CAC score. Will start atovastatin 20mg  daily. She was intolerant to rosuvastatin. Follow up labs in 3 months.

## 2021-10-09 ENCOUNTER — Other Ambulatory Visit: Payer: Self-pay | Admitting: Orthopedic Surgery

## 2021-10-09 ENCOUNTER — Encounter (HOSPITAL_BASED_OUTPATIENT_CLINIC_OR_DEPARTMENT_OTHER): Payer: Self-pay | Admitting: Orthopedic Surgery

## 2021-10-15 ENCOUNTER — Encounter: Payer: Self-pay | Admitting: *Deleted

## 2021-10-17 ENCOUNTER — Ambulatory Visit (HOSPITAL_BASED_OUTPATIENT_CLINIC_OR_DEPARTMENT_OTHER): Payer: Medicaid Other | Admitting: Anesthesiology

## 2021-10-17 ENCOUNTER — Encounter (HOSPITAL_BASED_OUTPATIENT_CLINIC_OR_DEPARTMENT_OTHER)
Admission: RE | Disposition: A | Discharge status: Home / Self Care | Payer: Self-pay | Source: Home / Self Care | Attending: Orthopedic Surgery

## 2021-10-17 ENCOUNTER — Other Ambulatory Visit: Payer: Self-pay

## 2021-10-17 ENCOUNTER — Ambulatory Visit (HOSPITAL_BASED_OUTPATIENT_CLINIC_OR_DEPARTMENT_OTHER)
Admission: RE | Admit: 2021-10-17 | Discharge: 2021-10-17 | Disposition: A | Discharge status: Home / Self Care | Payer: Medicaid Other | Attending: Orthopedic Surgery | Admitting: Orthopedic Surgery

## 2021-10-17 ENCOUNTER — Encounter (HOSPITAL_BASED_OUTPATIENT_CLINIC_OR_DEPARTMENT_OTHER): Payer: Self-pay | Admitting: Orthopedic Surgery

## 2021-10-17 DIAGNOSIS — J449 Chronic obstructive pulmonary disease, unspecified: Secondary | ICD-10-CM | POA: Diagnosis not present

## 2021-10-17 DIAGNOSIS — G5601 Carpal tunnel syndrome, right upper limb: Secondary | ICD-10-CM | POA: Diagnosis not present

## 2021-10-17 DIAGNOSIS — M65331 Trigger finger, right middle finger: Secondary | ICD-10-CM | POA: Insufficient documentation

## 2021-10-17 DIAGNOSIS — M65311 Trigger thumb, right thumb: Secondary | ICD-10-CM | POA: Diagnosis not present

## 2021-10-17 DIAGNOSIS — F419 Anxiety disorder, unspecified: Secondary | ICD-10-CM | POA: Diagnosis not present

## 2021-10-17 DIAGNOSIS — F17210 Nicotine dependence, cigarettes, uncomplicated: Secondary | ICD-10-CM | POA: Diagnosis not present

## 2021-10-17 DIAGNOSIS — I2510 Atherosclerotic heart disease of native coronary artery without angina pectoris: Secondary | ICD-10-CM | POA: Diagnosis not present

## 2021-10-17 HISTORY — PX: TRIGGER FINGER RELEASE: SHX641

## 2021-10-17 HISTORY — PX: CARPAL TUNNEL RELEASE: SHX101

## 2021-10-17 LAB — POCT PREGNANCY, URINE: Preg Test, Ur: NEGATIVE

## 2021-10-17 SURGERY — CARPAL TUNNEL RELEASE
Anesthesia: General | Site: Wrist | Laterality: Right

## 2021-10-17 MED ORDER — HYDROCODONE-ACETAMINOPHEN 5-325 MG PO TABS
ORAL_TABLET | ORAL | 0 refills | Status: DC
Start: 2021-10-17 — End: 2022-03-18

## 2021-10-17 MED ORDER — DEXAMETHASONE SODIUM PHOSPHATE 10 MG/ML IJ SOLN
INTRAMUSCULAR | Status: DC | PRN
  Administered 2021-10-17: 5 mg via INTRAVENOUS

## 2021-10-17 MED ORDER — PROMETHAZINE HCL 25 MG/ML IJ SOLN: 6.2500 mg | INTRAMUSCULAR | Status: DC | PRN

## 2021-10-17 MED ORDER — PROPOFOL 10 MG/ML IV BOLUS
INTRAVENOUS | Status: DC | PRN
  Administered 2021-10-17: 150 mg via INTRAVENOUS

## 2021-10-17 MED ORDER — 0.9 % SODIUM CHLORIDE (POUR BTL) OPTIME
TOPICAL | Status: DC | PRN
  Administered 2021-10-17: 12:00:00 60 mL

## 2021-10-17 MED ORDER — CLINDAMYCIN PHOSPHATE 900 MG/50ML IV SOLN
INTRAVENOUS | Status: AC
  Filled 2021-10-17: qty 50

## 2021-10-17 MED ORDER — DEXAMETHASONE SODIUM PHOSPHATE 10 MG/ML IJ SOLN
INTRAMUSCULAR | Status: AC
  Filled 2021-10-17: qty 1

## 2021-10-17 MED ORDER — LIDOCAINE HCL (CARDIAC) PF 100 MG/5ML IV SOSY
PREFILLED_SYRINGE | INTRAVENOUS | Status: DC | PRN
  Administered 2021-10-17: 60 mg via INTRAVENOUS

## 2021-10-17 MED ORDER — LIDOCAINE 2% (20 MG/ML) 5 ML SYRINGE
INTRAMUSCULAR | Status: AC
  Filled 2021-10-17: qty 5

## 2021-10-17 MED ORDER — OXYCODONE HCL 5 MG PO TABS: 5.0000 mg | ORAL_TABLET | Freq: Once | ORAL | Status: DC | PRN

## 2021-10-17 MED ORDER — ONDANSETRON HCL 4 MG/2ML IJ SOLN
INTRAMUSCULAR | Status: AC
  Filled 2021-10-17: qty 2

## 2021-10-17 MED ORDER — MIDAZOLAM HCL 2 MG/2ML IJ SOLN
INTRAMUSCULAR | Status: AC
  Filled 2021-10-17: qty 2

## 2021-10-17 MED ORDER — FENTANYL CITRATE (PF) 100 MCG/2ML IJ SOLN
INTRAMUSCULAR | Status: DC | PRN
  Administered 2021-10-17 (×2): 50 ug via INTRAVENOUS

## 2021-10-17 MED ORDER — PROPOFOL 10 MG/ML IV BOLUS
INTRAVENOUS | Status: AC
  Filled 2021-10-17: qty 20

## 2021-10-17 MED ORDER — FENTANYL CITRATE (PF) 100 MCG/2ML IJ SOLN
25.0000 ug | INTRAMUSCULAR | Status: DC | PRN
  Administered 2021-10-17 (×2): 50 ug via INTRAVENOUS

## 2021-10-17 MED ORDER — MIDAZOLAM HCL 5 MG/5ML IJ SOLN
INTRAMUSCULAR | Status: DC | PRN
  Administered 2021-10-17: 2 mg via INTRAVENOUS

## 2021-10-17 MED ORDER — FENTANYL CITRATE (PF) 100 MCG/2ML IJ SOLN
INTRAMUSCULAR | Status: AC
  Filled 2021-10-17: qty 2

## 2021-10-17 MED ORDER — OXYCODONE HCL 5 MG/5ML PO SOLN: 5.0000 mg | Freq: Once | ORAL | Status: DC | PRN

## 2021-10-17 MED ORDER — ONDANSETRON HCL 4 MG/2ML IJ SOLN
INTRAMUSCULAR | Status: DC | PRN
  Administered 2021-10-17: 4 mg via INTRAVENOUS

## 2021-10-17 MED ORDER — LACTATED RINGERS IV SOLN: INTRAVENOUS | Status: DC

## 2021-10-17 MED ORDER — BUPIVACAINE HCL (PF) 0.25 % IJ SOLN
INTRAMUSCULAR | Status: DC | PRN
  Administered 2021-10-17: 10 mL

## 2021-10-17 MED ORDER — CLINDAMYCIN PHOSPHATE 900 MG/50ML IV SOLN
900.0000 mg | INTRAVENOUS | Status: AC
  Administered 2021-10-17: 11:00:00 900 mg via INTRAVENOUS

## 2021-10-17 SURGICAL SUPPLY — 36 items
APL PRP STRL LF DISP 70% ISPRP (MISCELLANEOUS) ×2
BLADE SURG 15 STRL LF DISP TIS (BLADE) ×4 IMPLANT
BLADE SURG 15 STRL SS (BLADE) ×6
BNDG CMPR 9X4 STRL LF SNTH (GAUZE/BANDAGES/DRESSINGS) ×2
BNDG ELASTIC 3X5.8 VLCR STR LF (GAUZE/BANDAGES/DRESSINGS) ×3 IMPLANT
BNDG ESMARK 4X9 LF (GAUZE/BANDAGES/DRESSINGS) ×1 IMPLANT
BNDG GAUZE ELAST 4 BULKY (GAUZE/BANDAGES/DRESSINGS) ×3 IMPLANT
CHLORAPREP W/TINT 26 (MISCELLANEOUS) ×3 IMPLANT
CORD BIPOLAR FORCEPS 12FT (ELECTRODE) ×3 IMPLANT
COVER BACK TABLE 60X90IN (DRAPES) ×3 IMPLANT
COVER MAYO STAND STRL (DRAPES) ×3 IMPLANT
DRAPE EXTREMITY T 121X128X90 (DISPOSABLE) ×3 IMPLANT
DRAPE U-SHAPE 47X51 STRL (DRAPES) ×1 IMPLANT
DRSG PAD ABDOMINAL 8X10 ST (GAUZE/BANDAGES/DRESSINGS) ×3 IMPLANT
GAUZE SPONGE 4X4 12PLY STRL (GAUZE/BANDAGES/DRESSINGS) ×3 IMPLANT
GAUZE XEROFORM 1X8 LF (GAUZE/BANDAGES/DRESSINGS) ×3 IMPLANT
GLOVE SRG 8 PF TXTR STRL LF DI (GLOVE) ×2 IMPLANT
GLOVE SURG ENC MOIS LTX SZ7.5 (GLOVE) ×3 IMPLANT
GLOVE SURG ENC MOIS LTX SZ8.5 (GLOVE) ×1 IMPLANT
GLOVE SURG POLYISO LF SZ6.5 (GLOVE) ×1 IMPLANT
GLOVE SURG UNDER POLY LF SZ7 (GLOVE) ×1 IMPLANT
GLOVE SURG UNDER POLY LF SZ8 (GLOVE) ×6
GOWN STRL REUS W/ TWL LRG LVL3 (GOWN DISPOSABLE) ×2 IMPLANT
GOWN STRL REUS W/ TWL XL LVL3 (GOWN DISPOSABLE) IMPLANT
GOWN STRL REUS W/TWL LRG LVL3 (GOWN DISPOSABLE) ×3
GOWN STRL REUS W/TWL XL LVL3 (GOWN DISPOSABLE) ×7 IMPLANT
NDL HYPO 25X1 1.5 SAFETY (NEEDLE) ×2 IMPLANT
NEEDLE HYPO 25X1 1.5 SAFETY (NEEDLE) ×3 IMPLANT
NS IRRIG 1000ML POUR BTL (IV SOLUTION) ×3 IMPLANT
PACK BASIN DAY SURGERY FS (CUSTOM PROCEDURE TRAY) ×3 IMPLANT
STOCKINETTE 4X48 STRL (DRAPES) ×3 IMPLANT
SUT ETHILON 4 0 PS 2 18 (SUTURE) ×4 IMPLANT
SYR BULB EAR ULCER 3OZ GRN STR (SYRINGE) ×3 IMPLANT
SYR CONTROL 10ML LL (SYRINGE) ×3 IMPLANT
TOWEL GREEN STERILE FF (TOWEL DISPOSABLE) ×6 IMPLANT
UNDERPAD 30X36 HEAVY ABSORB (UNDERPADS AND DIAPERS) ×3 IMPLANT

## 2021-10-17 NOTE — Transfer of Care (Signed)
Immediate Anesthesia Transfer of Care Note  Patient: Kendra Moreno  Procedure(s) Performed: RIGHT CARPAL TUNNEL RELEASE (Right: Wrist) RELEASE TRIGGER FINGER/A-1 PULLEY RIGHT THUMB, RIGHT MIDDLE FINGER (Right: Finger)  Patient Location: PACU  Anesthesia Type:General  Level of Consciousness: drowsy and patient cooperative  Airway & Oxygen Therapy: Patient Spontanous Breathing and Patient connected to face mask oxygen  Post-op Assessment: Report given to RN and Post -op Vital signs reviewed and stable  Post vital signs: Reviewed and stable  Last Vitals:  Vitals Value Taken Time  BP 100/64 10/17/21 1142  Temp    Pulse 70 10/17/21 1143  Resp 20 10/17/21 1143  SpO2 96 % 10/17/21 1143  Vitals shown include unvalidated device data.  Last Pain:  Vitals:   10/17/21 1008  TempSrc: Oral  PainSc: 0-No pain      Patients Stated Pain Goal: 6 (10/17/21 1008)  Complications: No notable events documented.

## 2021-10-17 NOTE — Anesthesia Procedure Notes (Signed)
Procedure Name: LMA Insertion Date/Time: 10/17/2021 11:00 AM Performed by: Burna Cash, CRNA Pre-anesthesia Checklist: Patient identified, Emergency Drugs available, Suction available and Patient being monitored Patient Re-evaluated:Patient Re-evaluated prior to induction Oxygen Delivery Method: Circle system utilized Preoxygenation: Pre-oxygenation with 100% oxygen Induction Type: IV induction Ventilation: Mask ventilation without difficulty LMA: LMA inserted LMA Size: 4.0 Number of attempts: 1 Airway Equipment and Method: Bite block Placement Confirmation: positive ETCO2 Tube secured with: Tape Dental Injury: Teeth and Oropharynx as per pre-operative assessment

## 2021-10-17 NOTE — Discharge Instructions (Addendum)

## 2021-10-17 NOTE — Anesthesia Postprocedure Evaluation (Signed)
Anesthesia Post Note  Patient: [name]  Procedure(s) Performed: RIGHT CARPAL TUNNEL RELEASE (Right: Wrist) RELEASE TRIGGER FINGER/A-1 PULLEY RIGHT THUMB, RIGHT MIDDLE FINGER (Right: Finger)     Patient location during evaluation: PACU Anesthesia Type: General Level of consciousness: sedated and patient cooperative Pain management: pain level controlled Vital Signs Assessment: post-procedure vital signs reviewed and stable Respiratory status: spontaneous breathing Cardiovascular status: stable Anesthetic complications: no   No notable events documented.  Last Vitals:  Vitals:   10/17/21 1215 10/17/21 1231  BP: 107/67 (!) 122/50  Pulse: 69 75  Resp: 19 18  Temp:  36.7 C  SpO2: 94% 97%    Last Pain:  Vitals:   10/17/21 1231  TempSrc:   PainSc: 2                  Lewie Loron

## 2021-10-17 NOTE — H&P (Signed)
°  Kendra Moreno is an 45 y.o. female.   Chief Complaint: carpal tunnel, trigger digits HPI: 45 yo female with numbness and tingling right hand.  Triggering thumb and long fingers.  Positive nerve conduction studies.  She wishes to have right carpal tunnel release and right thumb and long finger trigger release.  Allergies:  Allergies  Allergen Reactions   Penicillins Anaphylaxis, Other (See Comments) and Hives   Sulfa Antibiotics Anaphylaxis and Other (See Comments)   Other Other (See Comments)    ALL TREE NUTS, can eat peanuts  ALL TREE NUTS, can eat peanuts ALL TREE NUTS, can eat peanuts   Apple Itching and Rash    Past Medical History:  Diagnosis Date   Anxiety    CAD (coronary artery disease)    Cigarette smoker    COPD (chronic obstructive pulmonary disease) (HCC)    EKG abnormalities    Heart block    Hypercholesteremia    MRSA (methicillin resistant Staphylococcus aureus)    Patient states she has had MRSA twice "years ago".   Seizures (HCC)     Past Surgical History:  Procedure Laterality Date   ACNE CYST REMOVAL     CARPAL TUNNEL RELEASE Left 09/16/2021   Procedure: LEFT CARPAL TUNNEL RELEASE;  Surgeon: Betha Loa, MD;  Location: [facility];  Service: Orthopedics;  Laterality: Left;  45 MIN   CESAREAN SECTION     TRIGGER FINGER RELEASE Left 09/16/2021   Procedure: RELEASE TRIGGER FINGER/A-1 PULLEY LEFT INDEX FINGER;  Surgeon: Betha Loa, MD;  Location: [facility];  Service: Orthopedics;  Laterality: Left;    Family History: Family History  Problem Relation Age of Onset   Diabetes Mother    Colon cancer Mother    COPD Father    Asthma Sister    COPD Sister    Cancer Other     Social History:   reports that she has been smoking cigarettes. She has a 3.00 pack-year smoking history. She has never used smokeless tobacco. She reports that she does not drink alcohol and does not use drugs.  Medications: No medications  prior to admission.    No results found for this or any previous visit (from the past 48 hour(s)).  No results found.    Height 5\' 6"  (1.676 m), weight 56.7 kg, unknown if currently breastfeeding.  General appearance: alert, cooperative, and appears stated age Head: Normocephalic, without obvious abnormality, atraumatic Neck: supple, symmetrical, trachea midline Cardio: regular rate and rhythm Resp: clear to auscultation bilaterally Extremities: Intact sensation and capillary refill all digits.  +epl/fpl/io.  No wounds.  Pulses: 2+ and symmetric Skin: Skin color, texture, turgor normal. No rashes or lesions Neurologic: Grossly normal Incision/Wound: none  Assessment/Plan Right carpal tunnel syndrome and right thumb and long finger trigger digits.  Non operative and operative treatment options have been discussed with the patient and patient wishes to proceed with operative treatment. Risks, benefits, and alternatives of surgery have been discussed and the patient agrees with the plan of care.   10/17/2021, 8:47 AM

## 2021-10-17 NOTE — Op Note (Signed)
10/17/2021 [facility]                              OPERATIVE REPORT   PREOPERATIVE DIAGNOSIS:   Right carpal tunnel syndrome. Right thumb trigger digit Right long finger trigger digit  POSTOPERATIVE DIAGNOSIS:   Right carpal tunnel syndrome. Right thumb trigger digit Right long finger trigger digit  PROCEDURE:   Right carpal tunnel release Right thumb trigger release Right long finger trigger release  SURGEON:  Betha Loa, MD  ASSISTANT:  Cindee Salt, MD  ANESTHESIA: General  IV FLUIDS:  Per anesthesia flow sheet.  ESTIMATED BLOOD LOSS:  Minimal.  COMPLICATIONS:  None.  SPECIMENS:  None.  TOURNIQUET TIME:    Total Tourniquet Time Documented: Upper Arm (Right) - 26 minutes Total: Upper Arm (Right) - 26 minutes   DISPOSITION:  Stable to PACU.  LOCATION: [facility]  INDICATIONS:  45 yo female with numbness and tingling right hand.  Triggering of thumb and long fingers.  Positive nerve conduction studies.   She wishes to have a carpal tunnel and thumb and long finger trigger release for management of her symptoms.  Risks, benefits and alternatives of surgery were discussed including the risk of blood loss; infection; damage to nerves, vessels, tendons, ligaments, bone; failure of surgery; need for additional surgery; complications with wound healing; continued pain; recurrence of carpal tunnel syndrome; and damage to motor branch. She voiced understanding of these risks and elected to proceed.   OPERATIVE COURSE:  After being identified preoperatively by myself, the patient and I agreed upon the procedure and site of procedure.  The surgical site was marked.  Surgical consent had been signed.  She was given preoperative IV antibiotic prophylaxis.  She was transferred to the operating room and placed on the operating room table in supine position with the right upper extremity on an armboard.  General anesthesia was induced by the  anesthesiologist.  Right upper extremity was prepped and draped in normal sterile orthopaedic fashion.  A surgical pause was performed between the surgeons, anesthesia, and operating room staff, and all were in agreement as to the patient, procedure, and site of procedure.  Tourniquet at the proximal aspect of the extremity was inflated to 250 mmHg after exsanguination of the arm with an Esmarch bandage  An incision was made transversely at the MP flexion crease of the thumb.  This was made through the skin only.  Spreading technique was used.  The radial and ulnar digital nerves were identified and were protected throughout the case. The flexor sheath was identified.  The A1 pulley was identified.  It was sharply incised.  It was released in its entirety.  Care was taken to avoid any release of the oblique pulley. The thumb was placed through a range of motion, there was noted to be no catching.  An incision was made at the volar aspect of the MP joint of the long finger.  This was carried into the subcutaneous tissues by spreading technique.  Bipolar electrocautery was used to obtain hemostasis.  The radial and ulnar digital nerves were protected throughout the case. The flexor sheath was identified.  The A1 pulley was identified and sharply incised.  It was released in its entirety.  The proximal 1-2 mm of the A2 pulley was vented to allow better excursion of the tendons.  The finger was placed through a range of motion and there was noted  to be no catching.  The tendons were brought through the wound and any adherences released.  The wounds were copiously irrigated with sterile saline. They were then closed with 4-0 nylon in a horizontal mattress fashion.  An incision was made over the transverse carpal ligament and carried into the subcutaneous tissues by spreading technique.  Bipolar electrocautery was used to obtain hemostasis.  The palmar fascia was sharply incised.  The transverse carpal ligament was  identified and sharply incised.  It was incised distally first.  The flexor tendons were identified.  The flexor tendon to the little finger was identified and retracted radially.  The transverse carpal ligament was then incised proximally.  Scissors were used to split the distal aspect of the volar antebrachial fascia.  A finger was placed into the wound to ensure complete decompression, which was the case.  The nerve was examined.  It was adherent to the radial leaflet.  The motor branch was identified and was intact.  The wound was copiously irrigated with sterile saline.  It was then closed with 4-0 nylon in a horizontal mattress fashion.  The wounds were injected with 0.25% plain Marcaine to aid in postoperative analgesia.  They were dressed with sterile Xeroform, 4x4s, an ABD, and wrapped with Kerlix and an Ace bandage.  Tourniquet was deflated at 26 minutes.  Fingertips were pink with brisk capillary refill after deflation of the tourniquet.  Operative drapes were broken down.  The patient was awoken from anesthesia safely.  She was transferred back to stretcher and taken to the PACU in stable condition.  I will see her back in the office in 1 week for postoperative followup.  I will give her a prescription for Norco 5/325 1-2 tabs PO q6 hours prn pain, dispense # 20.    Betha Loa, MD Electronically signed, 10/17/21

## 2021-10-17 NOTE — Anesthesia Preprocedure Evaluation (Addendum)
Anesthesia Evaluation  Patient identified by MRN, date of birth, ID band Patient awake    Reviewed: Allergy & Precautions, H&P , NPO status , Patient's Chart, lab work & pertinent test results  Airway Mallampati: II  TM Distance: >3 FB Neck ROM: Full    Dental  (+) Dental Advisory Given, Teeth Intact   Pulmonary COPD, Current Smoker and Patient abstained from smoking.,    Pulmonary exam normal breath sounds clear to auscultation       Cardiovascular + CAD  Normal cardiovascular exam+ dysrhythmias  Rhythm:Regular Rate:Normal     Neuro/Psych Seizures -,  Anxiety negative psych ROS   GI/Hepatic negative GI ROS, Neg liver ROS,   Endo/Other  negative endocrine ROS  Renal/GU negative Renal ROS  negative genitourinary   Musculoskeletal negative musculoskeletal ROS (+)   Abdominal   Peds negative pediatric ROS (+)  Hematology negative hematology ROS (+)   Anesthesia Other Findings   Reproductive/Obstetrics negative OB ROS                           Anesthesia Physical  Anesthesia Plan  ASA: 3  Anesthesia Plan: General   Post-op Pain Management: Minimal or no pain anticipated and Tylenol PO (pre-op)   Induction: Intravenous  PONV Risk Score and Plan: 2 and Ondansetron, Midazolam and Treatment may vary due to age or medical condition  Airway Management Planned: LMA  Additional Equipment: None  Intra-op Plan:   Post-operative Plan: Extubation in OR  Informed Consent: I have reviewed the patients History and Physical, chart, labs and discussed the procedure including the risks, benefits and alternatives for the proposed anesthesia with the patient or authorized representative who has indicated his/her understanding and acceptance.     Dental advisory given  Plan Discussed with: CRNA  Anesthesia Plan Comments:       Anesthesia Quick Evaluation

## 2021-10-17 NOTE — Op Note (Signed)
I assisted Surgeon(s) and Role:    * Betha Loa, MD - Primary    * Cindee Salt, MD - Assisting on the Procedure(s): RIGHT CARPAL TUNNEL RELEASE RELEASE TRIGGER FINGER/A-1 PULLEY RIGHT THUMB, RIGHT MIDDLE FINGER on 10/17/2021.  I provided assistance on this case as follows: setup approach to trigger digits and release, appproach and decompression median nerve, closure of the incisions and application of the dressings  Electronically signed by: Cindee Salt, MD Date: 10/17/2021 Time: 11:38 AM

## 2021-10-22 ENCOUNTER — Encounter (HOSPITAL_BASED_OUTPATIENT_CLINIC_OR_DEPARTMENT_OTHER): Payer: Self-pay | Admitting: Orthopedic Surgery

## 2021-12-10 ENCOUNTER — Ambulatory Visit: Payer: Medicaid Other | Admitting: Cardiology

## 2021-12-17 ENCOUNTER — Telehealth: Payer: Self-pay

## 2021-12-17 NOTE — Telephone Encounter (Signed)
-----   Message from Richmond Campbell sent at 12/17/2021 10:56 AM EST ----- Regarding: Patient experiencing symptoms Patient is requesting a call regarding potential symptoms of new medication, [phone] is best phone number.  Thank you!

## 2021-12-17 NOTE — Telephone Encounter (Signed)
Patient called to ask about side effects of Atorvastatin. She states she woke up this morning with neck soreness, and was asking if this could be a potential side effect of her statin.   Explained that some patients may experience muscle pain while taking statin medications. Patient states she just starting taking her Atorvastatin regularly about 2 weeks ago.  Patient states she will wait another week to see if the soreness goes away or persists. Advised patient to call our office if no improvement to discuss other options.  Patient verbalized understanding and agrees with plan above.

## 2021-12-30 ENCOUNTER — Telehealth: Payer: Self-pay

## 2021-12-30 DIAGNOSIS — E785 Hyperlipidemia, unspecified: Secondary | ICD-10-CM

## 2021-12-30 NOTE — Telephone Encounter (Signed)
I have attempted to call Kendra Moreno and Kendra Moreno and got the call cannot be completed at this time. I then proceeded to call Kendra Moreno and it stated that the number was disconnected. I will try 2 more times to schedule lipid fasting lab  ?

## 2021-12-30 NOTE — Telephone Encounter (Signed)
-----   Message from Ramond Dial, [place] sent at 12/30/2021  8:29 AM EDT ----- ? ?----- Message ----- ?From: Ramond Dial, RPH-CPP ?Sent: 12/26/2021  12:00 AM EDT ?To: Ramond Dial, RPH-CPP ? ?Set up lipids ? ? ?

## 2022-01-01 NOTE — Telephone Encounter (Signed)
2ND ATTEMPT: ?I have attempted to call mrs. Kendra Moreno and her husband and got the call cannot be completed at this time. I then proceeded to call her mother and it stated that the number was disconnected ?

## 2022-01-06 NOTE — Telephone Encounter (Signed)
3RD & FINAL ATTEMPT: ?I have attempted to call mrs. Eisinger and her husband and got the call cannot be completed at this time. I then proceeded to call her mother and it stated that the number was disconnected ?

## 2022-02-28 ENCOUNTER — Encounter: Payer: Self-pay | Admitting: Pulmonary Disease

## 2022-02-28 ENCOUNTER — Ambulatory Visit (INDEPENDENT_AMBULATORY_CARE_PROVIDER_SITE_OTHER): Payer: Medicaid Other

## 2022-02-28 ENCOUNTER — Ambulatory Visit: Payer: Medicaid Other | Admitting: Pulmonary Disease

## 2022-02-28 VITALS — BP 118/72 | HR 51 | Temp 98.0°F | Ht 66.0 in | Wt 127.4 lb

## 2022-02-28 DIAGNOSIS — R062 Wheezing: Secondary | ICD-10-CM

## 2022-02-28 LAB — CBC WITH DIFFERENTIAL/PLATELET
Basophils Absolute: 0.1 10*3/uL (ref 0.0–0.1)
Basophils Relative: 1.1 % (ref 0.0–3.0)
Eosinophils Absolute: 0.1 10*3/uL (ref 0.0–0.7)
Eosinophils Relative: 2.7 % (ref 0.0–5.0)
HCT: 37.8 % (ref 36.0–46.0)
Hemoglobin: 12.5 g/dL (ref 12.0–15.0)
Lymphocytes Relative: 35.4 % (ref 12.0–46.0)
Lymphs Abs: 1.7 10*3/uL (ref 0.7–4.0)
MCHC: 33 g/dL (ref 30.0–36.0)
MCV: 93.1 fl (ref 78.0–100.0)
Monocytes Absolute: 0.4 10*3/uL (ref 0.1–1.0)
Monocytes Relative: 9.1 % (ref 3.0–12.0)
Neutro Abs: 2.5 10*3/uL (ref 1.4–7.7)
Neutrophils Relative %: 51.7 % (ref 43.0–77.0)
Platelets: 157 10*3/uL (ref 150.0–400.0)
RBC: 4.06 Mil/uL (ref 3.87–5.11)
RDW: 13 % (ref 11.5–15.5)
WBC: 4.7 10*3/uL (ref 4.0–10.5)

## 2022-02-28 LAB — POCT EXHALED NITRIC OXIDE: FeNO level (ppb): <5

## 2022-02-28 MED ORDER — MONTELUKAST SODIUM 10 MG PO TABS: 10.0000 mg | ORAL_TABLET | Freq: Every day | ORAL | 5 refills | Status: DC

## 2022-02-28 MED ORDER — ALBUTEROL SULFATE HFA 108 (90 BASE) MCG/ACT IN AERS: 2.0000 | INHALATION_SPRAY | Freq: Four times a day (QID) | RESPIRATORY_TRACT | 5 refills | Status: DC | PRN

## 2022-02-28 MED ORDER — DOXYCYCLINE HYCLATE 100 MG PO TABS: 100.0000 mg | ORAL_TABLET | Freq: Two times a day (BID) | ORAL | 0 refills | Status: DC

## 2022-02-28 NOTE — Progress Notes (Signed)
? ?[facility] ? ?Chief Complaint  ?Patient presents with  ? Consult  ?  Wheezing/ SOB with activity   ? ? ?Past Surgical History:  ?She  has a past surgical history that includes Cesarean section; Acne cyst removal; Carpal tunnel release (Left, 09/16/2021); Trigger finger release (Left, 09/16/2021); Carpal tunnel release (Right, 10/17/2021); and Trigger finger release (Right, 10/17/2021). ? ?Past Medical History:  ?Anxiety, CAD, HLD, Seizures ? ?Constitutional:  ?BP 118/72 (BP Location: Left Arm, Patient Position: Sitting, Cuff Size: Normal)   Pulse (!) 51   Temp 98 ?F (36.7 ?C) (Oral)   Ht 5\' 6"  (1.676 m)   Wt 127 lb 6.4 oz (57.8 kg)   SpO2 98%   BMI 20.56 kg/m?  ? ?Brief Summary:  ?Kendra Moreno is a 46 y.o. female smoker with wheezing. ?  ? ? ? ?Subjective:  ? ?She is here with her husband. ? ?She gets intermittent episodes of wheezing.  This has been going on for years.  She started smoking at age 43, smoked 1 ppd, and quit this year.  Her wheezing continues she was told in the ER once that she has COPD, but she doesn't know how this was determined.  She has several relatives with COPD.  She is from [place].  Works as a [place].  Has pet dogs, cats, and bunnies.  No history of pneumonia or TB. ? ?She gets wheezing and sinus congestion.  Also gets watery eyes and sneezing.  Happens more when outside.  She has been using claritin.  Feels tight in her chest when she is doing activity and when she has wheezing.  She will wake up with a cough sometimes. ? ?She spends a lot of time outdoors.  She got bit by a tick recently.  She developed a rash on her left back around her axillary area. ? ?Physical Exam:  ? ?Appearance - well kempt  ? ?ENMT - no sinus tenderness, no oral exudate, no LAN, Mallampati 2 airway, no stridor ? ?Respiratory - equal breath sounds bilaterally, no wheezing or rales ? ?CV - s1s2 regular rate and rhythm, no murmurs ? ?Ext - no  clubbing, no edema ? ?Skin - warm, red, flat circular rash in Lt back/axillary area ? ?Psych - normal mood and affect ?  ?Pulmonary testing:  ?FeNO 02/28/22 >> less than 5 ? ?Chest Imaging:  ? ? ?Social History:  ?She  reports that she has been smoking cigarettes. She has a 3.00 pack-year smoking history. She has never used smokeless tobacco. She reports that she does not drink alcohol and does not use drugs. ? ?Family History:  ?Her family history includes Asthma in her sister; COPD in her father and sister; Cancer in an other family member; Colon cancer in her mother; Diabetes in her mother. ?  ? ? ?Assessment/Plan:  ? ?Wheezing and allergic rhinitis with history of smoking. ?- more concerning for allergic asthma, but COPD still a possibility ?- will have her start singulair and continue claritin ?- prn albuterol ?- will arrange for CBC with diff, IgE, RAST, CXR, and PFT ? ?Skin rash. ?- concerning for mild cellulitis ?- will give course of doxycycline ? ?Coronary artery disease. ?- followed by Dr. 04/30/22 with cardiology ? ?Time Spent Involved in Patient Care on Day of Examination:  ?50 minutes ? ?Follow up:  ? ?Patient Instructions  ?Doxycycline antibiotic 100 mg twice per day for 2 weeks ? ?Albuterol two puffs every 6  hours as needed for cough, wheeze, chest congestion, or shortness of breath ? ?Singulair 10 mg pill nightly ? ?Chest xray and lab tests today ? ?Will schedule pulmonary function test and follow up in 4 to 6 weeks ? ?Medication List:  ? ?Allergies as of 02/28/2022   ? ?   Reactions  ? Penicillins Anaphylaxis, Other (See Comments), Hives  ? Sulfa Antibiotics Anaphylaxis, Other (See Comments)  ? Other Other (See Comments)  ? ALL TREE NUTS, can eat peanuts ?ALL TREE NUTS, can eat peanuts ?ALL TREE NUTS, can eat peanuts  ? Apple Juice Itching, Rash  ? ?  ? ?  ?Medication List  ?  ? ?  ? Accurate as of Feb 28, 2022  9:55 AM. If you have any questions, ask your nurse or doctor.  ?  ?  ? ?  ? ?albuterol  108 (90 Base) MCG/ACT inhaler ?Commonly known as: Ventolin HFA ?Inhale 2 puffs into the lungs every 6 (six) hours as needed for wheezing or shortness of breath. ?Started by: Coralyn Helling, MD ?  ?atorvastatin 20 MG tablet ?Commonly known as: LIPITOR ?Take 1 tablet (20 mg total) by mouth daily. ?  ?diazepam 2 MG tablet ?Commonly known as: VALIUM ?Take 2 mg by mouth daily as needed for anxiety. ?  ?doxycycline 100 MG tablet ?Commonly known as: VIBRA-TABS ?Take 1 tablet (100 mg total) by mouth 2 (two) times daily. ?Started by: Coralyn Helling, MD ?  ?HYDROcodone-acetaminophen 5-325 MG tablet ?Commonly known as: Norco ?1-2 tabs po q6 hours prn pain ?  ?hydrOXYzine 10 MG tablet ?Commonly known as: ATARAX ?Take 10 mg by mouth 4 (four) times daily as needed for anxiety. ?  ?meloxicam 15 MG tablet ?Commonly known as: MOBIC ?Take 15 mg by mouth daily as needed. ?  ?montelukast 10 MG tablet ?Commonly known as: SINGULAIR ?Take 1 tablet (10 mg total) by mouth at bedtime. ?Started by: Coralyn Helling, MD ?  ?nicotine 14 mg/24hr patch ?Commonly known as: NICODERM CQ - dosed in mg/24 hours ?Place 1 patch (14 mg total) onto the skin daily. ?  ?traMADol 50 MG tablet ?Commonly known as: ULTRAM ?Take by mouth every 6 (six) hours as needed. ?  ? ?  ? ? ?Signature:  ?Coralyn Helling, MD ?[facility] ?Pager - [phone] ?02/28/2022, 9:55 AM ?  ? ? ? ? ? ? ? ? ?

## 2022-02-28 NOTE — Patient Instructions (Addendum)
Doxycycline antibiotic 100 mg twice per day for 2 weeks ? ?Albuterol two puffs every 6 hours as needed for cough, wheeze, chest congestion, or shortness of breath ? ?Singulair 10 mg pill nightly ? ?Chest xray and lab tests today ? ?Will schedule pulmonary function test and follow up in 4 to 6 weeks ?

## 2022-03-02 IMAGING — CT CT HEART MORP W/ CTA COR W/ SCORE W/ CA W/CM &/OR W/O CM
4 of 7 series · 8 of 20 positions shown, 9 images · non-contrast
Comparison: None.
COMPARISON: None.

Addendum:
EXAM:
OVER-READ INTERPRETATION  CT CHEST

The following report is an over-read performed by radiologist Dr.
Rasheeda Rainwater [REDACTED] on 08/22/2020. This
over-read does not include interpretation of cardiac or coronary
anatomy or pathology. The coronary calcium score/coronary CTA
interpretation by the cardiologist is attached.
CLINICAL DATA: 44 year old with chest pain
Cardiac/Coronary  CTA
TECHNIQUE: The patient was scanned on a [employer] Force scanner.

[Series 6: best diast 74 % · axial · 0.39mm/px · z∈[+1111,+1154]mm · 2 of 321 slices shown, 3 images]
[im 107/321  vessel]
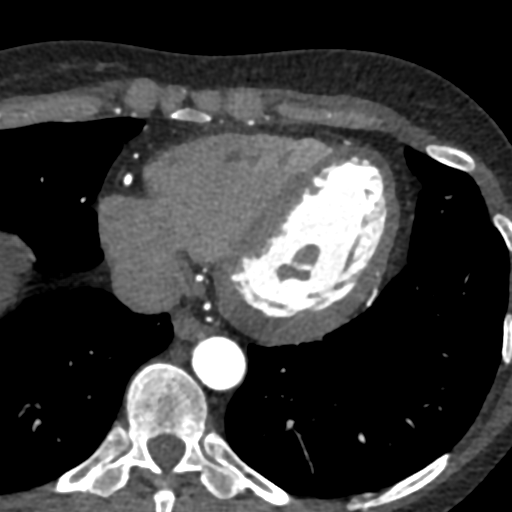
[im 107/321  lung]
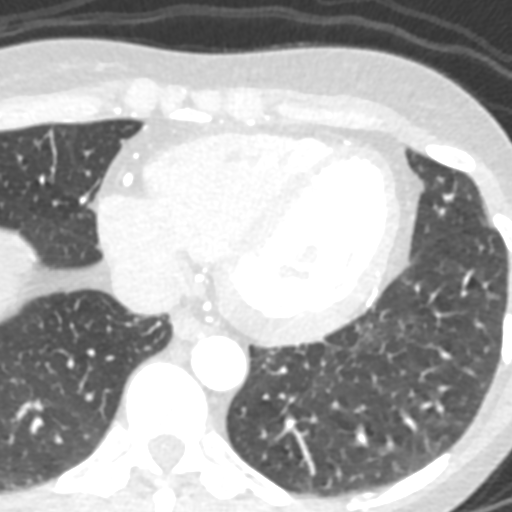
[im 214/321  vessel]
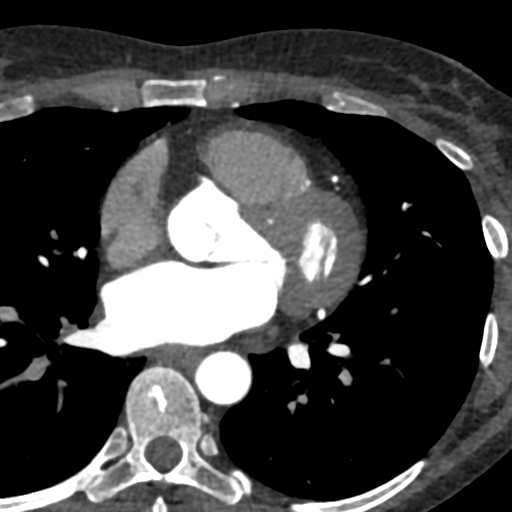

[Series 7: best syst 45 % · axial · 0.39mm/px · z∈[+1111,+1154]mm · 2 of 321 slices shown]
[im 107/321  vessel]
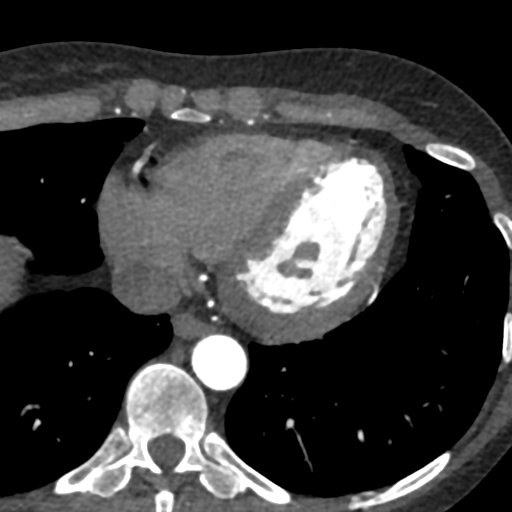
[im 214/321  vessel]
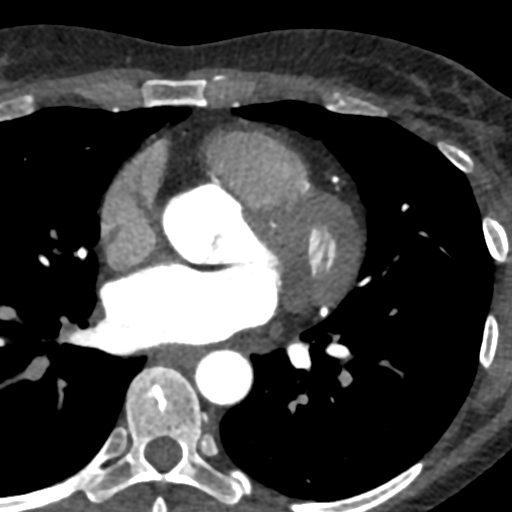

[Series 8: ts diast sharp 74 % · axial · 0.39mm/px · z∈[+1111,+1154]mm · 2 of 321 slices shown]
[im 107/321  lung]
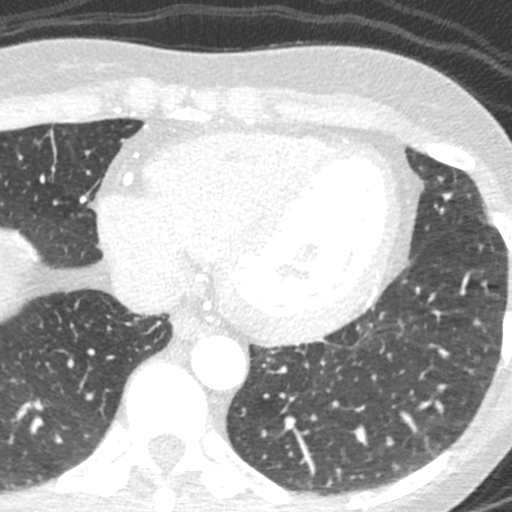
[im 214/321  lung]
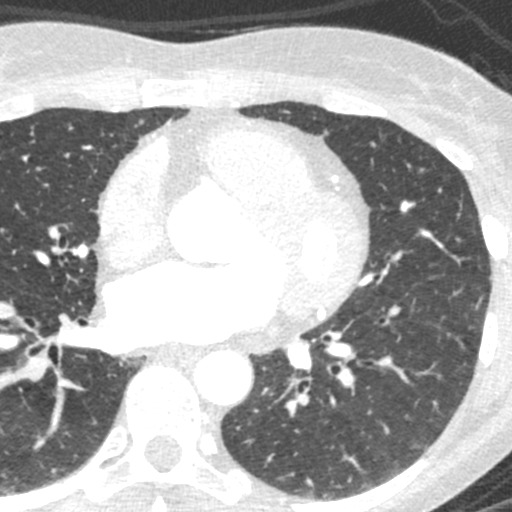

[Series 9: ts syst sharp 45 % · axial · 0.39mm/px · z∈[+1111,+1154]mm · 2 of 321 slices shown]
[im 107/321  lung]
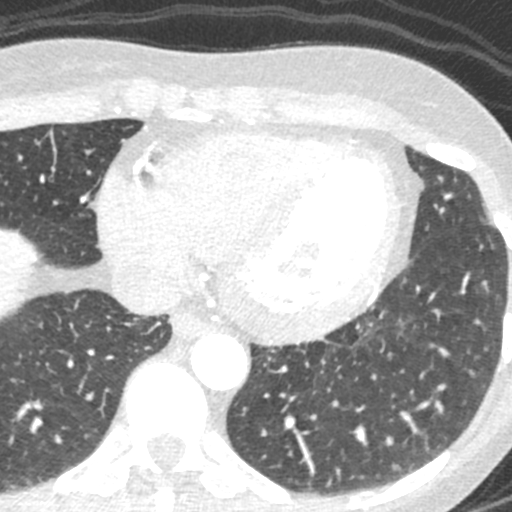
[im 214/321  lung]
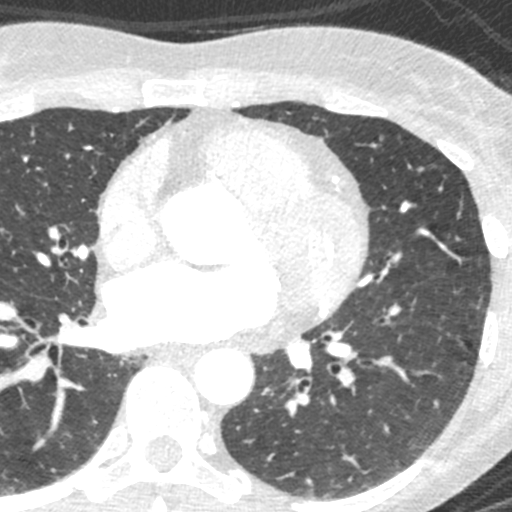

[8 of 20 positions shown; findings below may reference images not displayed]

FINDINGS: Within the visualized portions of the thorax there are no suspicious
appearing pulmonary nodules or masses, there is no acute
consolidative airspace disease, no pleural effusions, no
pneumothorax and no lymphadenopathy. Visualized portions of the
upper abdomen are unremarkable. There are no aggressive appearing
lytic or blastic lesions noted in the visualized portions of the
skeleton.
IMPRESSION: 1. No significant incidental noncardiac findings are noted.
FINDINGS: A 120 kV prospective scan was triggered in the descending thoracic
aorta at 111 HU's. Axial non-contrast 3 mm slices were carried out
through the heart. The data set was analyzed on a dedicated work
station and scored using the Agatson method. Gantry rotation speed
was 250 msecs and collimation was .6 mm. No beta blockade and 0.8 mg
of sl NTG was given. The 3D data set was reconstructed in 5%
intervals of the 67-82 % of the R-R cycle. Diastolic phases were
analyzed on a dedicated work station using MPR, MIP and VRT modes.
The patient received 80 cc of contrast.

Aorta:  Normal size.  No calcifications.  No dissection.

Aortic Valve:  Trileaflet.  No calcifications.

Coronary Arteries:  Normal coronary origin.  Right dominance.

RCA is a large dominant artery that gives rise to PDA and PLA. There
is no plaque.

Left main is a large artery that gives rise to LAD and LCX arteries.

LAD is a large vessel that has proximal focal calcified plaque,
0-24% stenosis.

LCX is a non-dominant artery that gives rise to one large OM1
branch. There is no plaque.

Other findings:

Normal pulmonary vein drainage into the left atrium.

Normal left atrial appendage without a thrombus.

Normal size of the pulmonary artery.

Please see radiology report for non cardiac findings.
IMPRESSION: 1. Coronary calcium score of 64 (proximal LAD). This was 99
percentile for age and sex matched control.

2. Normal coronary origin with right dominance.

3. Proximal LAD focal calcification 0-24% stenosis, non flow
limiting.

4. CAD-RADS 1. Minimal non-obstructive CAD (0-24%). Consider
non-atherosclerotic causes of chest pain. Consider preventive
therapy and risk factor modification.

*** End of Addendum ***
EXAM:
OVER-READ INTERPRETATION  CT CHEST

The following report is an over-read performed by radiologist Dr.
Rasheeda Rainwater [REDACTED] on 08/22/2020. This
over-read does not include interpretation of cardiac or coronary
anatomy or pathology. The coronary calcium score/coronary CTA
interpretation by the cardiologist is attached.
FINDINGS: Within the visualized portions of the thorax there are no suspicious
appearing pulmonary nodules or masses, there is no acute
consolidative airspace disease, no pleural effusions, no
pneumothorax and no lymphadenopathy. Visualized portions of the
upper abdomen are unremarkable. There are no aggressive appearing
lytic or blastic lesions noted in the visualized portions of the
skeleton.
IMPRESSION: 1. No significant incidental noncardiac findings are noted.

## 2022-03-03 LAB — RESPIRATORY ALLERGY PROFILE REGION II ~~LOC~~
Allergen, A. alternata, m6: <0.1 kU/L
Allergen, Cedar tree, t12: <0.1 kU/L
Allergen, Comm Silver Birch, t9: <0.1 kU/L
Allergen, Cottonwood, t14: <0.1 kU/L
Allergen, D pternoyssinus,d7: <0.1 kU/L
Allergen, Mouse Urine Protein, e78: <0.1 kU/L
Allergen, Mulberry, t76: <0.1 kU/L
Allergen, Oak,t7: 1.92 kU/L — ABNORMAL HIGH
Allergen, P. notatum, m1: <0.1 kU/L
Aspergillus fumigatus, m3: <0.1 kU/L
Bermuda Grass: <0.1 kU/L
Box Elder IgE: <0.1 kU/L
CLADOSPORIUM HERBARUM (M2) IGE: <0.1 kU/L
COMMON RAGWEED (SHORT) (W1) IGE: <0.1 kU/L
Cat Dander: 0.31 kU/L — ABNORMAL HIGH
Class: 0
Class: 0
Class: 0
Class: 0
Class: 0
Class: 0
Class: 0
Class: 0
Class: 0
Class: 0
Class: 0
Class: 0
Class: 0
Class: 0
Class: 0
Class: 0
Class: 0
Class: 0
Class: 1
Class: 1
Class: 2
Class: 2
Cockroach: 0.54 kU/L — ABNORMAL HIGH
D. farinae: <0.1 kU/L
Dog Dander: 0.59 kU/L — ABNORMAL HIGH
Elm IgE: <0.1 kU/L
IgE (Immunoglobulin E), Serum: 62 kU/L (ref <=114)
Johnson Grass: <0.1 kU/L
Pecan/Hickory Tree IgE: 0.14 kU/L — ABNORMAL HIGH
Rough Pigweed  IgE: <0.1 kU/L
Sheep Sorrel IgE: <0.1 kU/L
Timothy Grass: 2.36 kU/L — ABNORMAL HIGH

## 2022-03-03 LAB — INTERPRETATION:

## 2022-03-18 ENCOUNTER — Ambulatory Visit: Payer: Medicaid Other | Admitting: Cardiology

## 2022-03-18 ENCOUNTER — Encounter: Payer: Self-pay | Admitting: Cardiology

## 2022-03-18 DIAGNOSIS — I2510 Atherosclerotic heart disease of native coronary artery without angina pectoris: Secondary | ICD-10-CM | POA: Diagnosis not present

## 2022-03-18 DIAGNOSIS — E7800 Pure hypercholesterolemia, unspecified: Secondary | ICD-10-CM | POA: Diagnosis not present

## 2022-03-18 DIAGNOSIS — Z87891 Personal history of nicotine dependence: Secondary | ICD-10-CM | POA: Insufficient documentation

## 2022-03-18 NOTE — Assessment & Plan Note (Addendum)
Coronary calcium score 64, proximal LAD.  This was 99th percentile for age.  Performed on 08/22/2020.  No evidence of low limitation.  Continue with medical management.  Currently on atorvastatin 20 mg a day.  Currently taking low-dose aspirin 81 mg.  LDL goal less than 70.  It was 58 on 01/29/2022.  She still will occasionally have chest discomfort.  I reassured her that based upon her nonobstructive coronary plaque there was no evidence of anginal type pain.  Her chest discomfort certainly could be musculoskeletal, stress related, GI related potentially.

## 2022-03-18 NOTE — Progress Notes (Signed)
Cardiology Office Note:    Date:  03/18/2022   ID:  CORIN FORMISANO, DOB 08-19-1976, MRN [id number]  PCP:  [facility]  [facility] Cardiologist:  Donato Schultz, MD  [facility] Electrophysiologist:  None   Referring MD: [facility]*     History of Present Illness:    Kendra Moreno is a 46 y.o. female here for the follow up of coronary artery disease and hyperlipidemia.  During a previous visit, Mrs. Beirne had calcification of the LAD artery noted in hospitalization from October 2020 at the request of Dr. Waldon Reining in [facility].  She is a smoker approximately 5 cigarettes/day.  Review of last office note from Dr. Dahlia Client on 06/12/2020 reveals that she was hospitalized in last October and had a CT scan of her heart that showed calcification of the left anterior descending artery.  Her EKG also showed some nonspecific changes in the lateral leads.  I was also able to review a prior office note from Dr. Molly Maduro at [facility] from 01/23/2015 where she had had recurrent episodes of chest pain.  Substernal sharp no radiation.  Family history was significant for CAD.  An exercise stress test was performed and was negative, able to exercise for 13 minutes on 02/15/2015.  EKG from 05/14/2020 personally reviewed and interpreted shows sinus rhythm with T wave inversions noted in lead V2 and V3, nonspecific as well as heart rate of 48 bpm.  Having chest pain frequently, left arm pain. Tired in evenengs. Sometimes radiates to shoulders. Going up hill stops her in pain.   She had her mother-in-law recently died from heart attack.  Her husband is here with her.  Clearly anxious about this.    Today, she is accompanied with her husband, but states that she still has discomfort from chest pain. Her chest angina has progressively become less severe as of now. She also reports of abdominal pain as well.   She quit tobacco 5 months ago.  She remains  compliant with her rosuvastatin, Asprin, singular, and Chantix. Of note, She wants to stop taking the Chantix.   The patient denies shortness of breath, nocturnal dyspnea, orthopnea or peripheral edema.  There have been no palpitations, lightheadedness or syncope.  Complains of chest pain and abdominal (muscle) region discomfort.   Past Medical History:  Diagnosis Date   Anxiety    CAD (coronary artery disease)    Cigarette smoker    COPD (chronic obstructive pulmonary disease) (HCC)    EKG abnormalities    Heart block    Hypercholesteremia    MRSA (methicillin resistant Staphylococcus aureus)    Patient states she has had MRSA twice "years ago".   Seizures (HCC)     Past Surgical History:  Procedure Laterality Date   ACNE CYST REMOVAL     CARPAL TUNNEL RELEASE Left 09/16/2021   Procedure: LEFT CARPAL TUNNEL RELEASE;  Surgeon: Betha Loa, MD;  Location: [facility];  Service: Orthopedics;  Laterality: Left;  45 MIN   CARPAL TUNNEL RELEASE Right 10/17/2021   Procedure: RIGHT CARPAL TUNNEL RELEASE;  Surgeon: Betha Loa, MD;  Location: [facility];  Service: Orthopedics;  Laterality: Right;   CESAREAN SECTION     TRIGGER FINGER RELEASE Left 09/16/2021   Procedure: RELEASE TRIGGER FINGER/A-1 PULLEY LEFT INDEX FINGER;  Surgeon: Betha Loa, MD;  Location: [facility];  Service: Orthopedics;  Laterality: Left;   TRIGGER FINGER RELEASE Right 10/17/2021   Procedure:  RELEASE TRIGGER FINGER/A-1 PULLEY RIGHT THUMB, RIGHT MIDDLE FINGER;  Surgeon: Betha Loa, MD;  Location: [facility];  Service: Orthopedics;  Laterality: Right;    Current Medications: Current Meds  Medication Sig   albuterol (VENTOLIN HFA) 108 (90 Base) MCG/ACT inhaler Inhale 2 puffs into the lungs every 6 (six) hours as needed for wheezing or shortness of breath.   aspirin EC 81 MG tablet Take 81 mg by mouth daily. Swallow whole.   atorvastatin (LIPITOR) 20 MG  tablet Take 1 tablet (20 mg total) by mouth daily.   diazepam (VALIUM) 2 MG tablet Take 2 mg by mouth daily as needed for anxiety.   hydrOXYzine (ATARAX/VISTARIL) 10 MG tablet Take 10 mg by mouth 4 (four) times daily as needed for anxiety.   meloxicam (MOBIC) 15 MG tablet Take 15 mg by mouth daily as needed.   montelukast (SINGULAIR) 10 MG tablet Take 1 tablet (10 mg total) by mouth at bedtime.   traMADol (ULTRAM) 50 MG tablet Take by mouth every 6 (six) hours as needed.   varenicline (CHANTIX) 0.5 MG tablet Take 1 mg by mouth at bedtime.     Allergies:   Penicillins, Sulfa antibiotics, Other, and Apple juice   Social History   Socioeconomic History   Marital status: Single    Spouse name: Not on file   Number of children: 2   Years of education: college   Highest education level: Not on file  Occupational History   Occupation: Hairdresser  Tobacco Use   Smoking status: Light Smoker    Packs/day: 0.30    Years: 10.00    Pack years: 3.00    Types: Cigarettes   Smokeless tobacco: Never   Tobacco comments:    Quit smoking 3 months ago. 02/28/22 ARJ, RN   Vaping Use   Vaping Use: Never used  Substance and Sexual Activity   Alcohol use: No   Drug use: No   Sexual activity: Yes  Other Topics Concern   Not on file  Social History Narrative   Lives at home with family.   Right-handed.   Two cups caffeine per day.    Social Determinants of Health   Financial Resource Strain: Not on file  Food Insecurity: Not on file  Transportation Needs: Not on file  Physical Activity: Not on file  Stress: Not on file  Social Connections: Not on file     Family History: The patient's family history includes Asthma in her sister; COPD in her father and sister; Cancer in an other family member; Colon cancer in her mother; Diabetes in her mother.  ROS:   Please see the history of present illness.    (+) Chest Angina (+) Abdominal region (muscle) discomfort  All other systems reviewed  and are negative.  EKGs/Labs/Other Studies Reviewed:    Long Term Monitor (3-7 days) 10/02/2021: Sinus rhythm with average heart rate of 83 bpm. Heart rate did increase at approximately 2 PM until close to bedtime with sinus tachycardia on 12/5 as well as 12/7 and 12/8. Episode of second-degree heart block type I noted on 09/25/2021 at 11:28 AM (PR interval appears to increase in length proceeding dropped QRS)-this was an isolated episode No pacemaker indication. Avoid AV nodal blocking agents such as beta-blockers.   Patch Wear Time:  4 days and 1 hours (2022-12-04T19:44:38-0500 to 2022-12-08T20:52:00-0500)   Patient had a min HR of 29 bpm, max HR of 147 bpm, and avg HR of 83 bpm. Predominant underlying rhythm was  Sinus Rhythm. 2 Supraventricular Tachycardia runs occurred, the run with the fastest interval lasting 4 beats with a max rate of 139 bpm, the  longest lasting 5 beats with an avg rate of 109 bpm. Second Degree AV Block-Mobitz I (Wenckebach) was present. Isolated SVEs were rare (<1.0%), SVE Couplets were rare (<1.0%), and no SVE Triplets were present. Isolated VEs were rare (<1.0%), VE Couplets  were rare (<1.0%), and no VE Triplets were present.    Myocardial Perfusion w/Lexiscan Stress Test 08/08/2020: Study Highlights Nuclear stress EF: 71%. There was no ST segment deviation noted during stress. No T wave inversion was noted during stress. The study is normal. This is a low risk study. The left ventricular ejection fraction is hyperdynamic (>65%).   1. Normal study without ischemia or infarction. 2. Reduced counts in the apex that improve on stress imaging with normal wall motion consistent with apical thinning artifact.  3. Normal LVEF, 71%. 4. This is a low-risk study.   EKG: EKG is personally reviewed.    November 2022-sinus rhythm T wave inversion V3.  Recent Labs: 06/04/2021: BUN 8; Creatinine, Ser 0.87; Potassium 4.6; Sodium 142; TSH 1.400 10/01/2021: ALT  16 02/28/2022: Hemoglobin 12.5; Platelets 157.0  Recent Lipid Panel    Component Value Date/Time   CHOL 174 10/01/2021 1006   TRIG 74 10/01/2021 1006   HDL 72 10/01/2021 1006   CHOLHDL 2.4 10/01/2021 1006   LDLCALC 88 10/01/2021 1006     Risk Assessment/Calculations:       Physical Exam:    VS:  BP 105/72 (BP Location: Left Arm, Patient Position: Sitting, Cuff Size: Normal)   Pulse 61   Ht 5\' 6"  (1.676 m)   Wt 129 lb (58.5 kg)   SpO2 96%   BMI 20.82 kg/m     Wt Readings from Last 3 Encounters:  03/18/22 129 lb (58.5 kg)  02/28/22 127 lb 6.4 oz (57.8 kg)  10/17/21 126 lb 5.2 oz (57.3 kg)     GEN:  Well nourished, well developed in no acute distress HEENT: Normal NECK: No JVD; No carotid bruits LYMPHATICS: No lymphadenopathy CARDIAC: RRR, no murmurs, rubs, gallops RESPIRATORY:  Clear to auscultation without rales, wheezing or rhonchi  ABDOMEN: Soft, non-tender, non-distended MUSCULOSKELETAL:  No edema; No deformity  SKIN: Warm and dry NEUROLOGIC:  Alert and oriented x 3 PSYCHIATRIC:  Normal affect, anxious  ASSESSMENT:    1. Coronary artery disease involving native coronary artery of native heart without angina pectoris   2. Pure hypercholesterolemia   3. Former smoker     PLAN:    In order of problems listed above:  Coronary artery disease involving native coronary artery of native heart without angina pectoris Coronary calcium score 64, proximal LAD.  This was 99th percentile for age.  Performed on 08/22/2020.  No evidence of low limitation.  Continue with medical management.  Currently on atorvastatin 20 mg a day.  Currently taking low-dose aspirin 81 mg.  LDL goal less than 70.  It was 58 on 01/29/2022.  She still will occasionally have chest discomfort.  I reassured her that based upon her nonobstructive coronary plaque there was no evidence of anginal type pain.  Her chest discomfort certainly could be musculoskeletal, stress related, GI related  potentially.  Hyperlipidemia 88 in 2022.  On atorvastatin 20 mg a day.  Former smoker They have been quit for 5 months.  Doing well.  Pure hypercholesterolemia Doing well with atorvastatin 20 mg.  No changes made.  LDL  as above.  No myalgias.    Medication Adjustments/Labs and Tests Ordered: Current medicines are reviewed at length with the patient today.  Concerns regarding medicines are outlined above.  No orders of the defined types were placed in this encounter.  No orders of the defined types were placed in this encounter.   Patient Instructions  Medication Instructions:  The current medical regimen is effective;  continue present plan and medications.  *If you need a refill on your cardiac medications before your next appointment, please call your pharmacy*  Follow-Up: At [facility], you and your health needs are our priority.  As part of our continuing mission to provide you with exceptional heart care, we have created designated Provider Care Teams.  These Care Teams include your primary Cardiologist (physician) and Advanced Practice Providers (APPs -  Physician Assistants and Nurse Practitioners) who all work together to provide you with the care you need, when you need it.  We recommend signing up for the patient portal called "MyChart".  Sign up information is provided on this After Visit Summary.  MyChart is used to connect with patients for Virtual Visits (Telemedicine).  Patients are able to view lab/test results, encounter notes, upcoming appointments, etc.  Non-urgent messages can be sent to your provider as well.   To learn more about what you can do with MyChart, go to [email or web address].    Your next appointment:   1 year(s)  The format for your next appointment:   In Person  Provider:   Donato Schultz, MD {   Important Information About Sugar         F/U in 1 year  I,Tinashe Williams,acting as a scribe for Donato Schultz, MD.,have documented  all relevant documentation on the behalf of Donato Schultz, MD,as directed by  Donato Schultz, MD while in the presence of Donato Schultz, MD.   I, Donato Schultz, MD, have reviewed all documentation for this visit. The documentation on 03/18/22 for the exam, diagnosis, procedures, and orders are all accurate and complete.

## 2022-03-18 NOTE — Assessment & Plan Note (Signed)
They have been quit for 5 months.  Doing well.

## 2022-03-18 NOTE — Assessment & Plan Note (Signed)
86 in 2022.  On atorvastatin 20 mg a day.

## 2022-03-18 NOTE — Patient Instructions (Signed)

## 2022-03-18 NOTE — Assessment & Plan Note (Signed)
Doing well with atorvastatin 20 mg.  No changes made.  LDL as above.  No myalgias.

## 2022-03-20 ENCOUNTER — Encounter (INDEPENDENT_AMBULATORY_CARE_PROVIDER_SITE_OTHER): Payer: Self-pay | Admitting: *Deleted

## 2022-05-05 ENCOUNTER — Encounter: Payer: Self-pay | Admitting: Pulmonary Disease

## 2022-05-05 ENCOUNTER — Ambulatory Visit (INDEPENDENT_AMBULATORY_CARE_PROVIDER_SITE_OTHER): Payer: Medicaid Other | Admitting: Pulmonary Disease

## 2022-05-05 ENCOUNTER — Ambulatory Visit: Payer: Medicaid Other | Admitting: Pulmonary Disease

## 2022-05-05 VITALS — BP 102/74 | HR 60 | Ht 65.5 in | Wt 133.8 lb

## 2022-05-05 DIAGNOSIS — R062 Wheezing: Secondary | ICD-10-CM

## 2022-05-05 LAB — PULMONARY FUNCTION TEST
DL/VA % pred: 100 %
DL/VA: 4.29 ml/min/mmHg/L
DLCO cor % pred: 88 %
DLCO cor: 19.75 ml/min/mmHg
DLCO unc % pred: 85 %
DLCO unc: 19.18 ml/min/mmHg
FEF 25-75 Post: 3.53 L/sec
FEF 25-75 Pre: 2.91 L/sec
FEF2575-%Change-Post: 21 %
FEF2575-%Pred-Post: 120 %
FEF2575-%Pred-Pre: 99 %
FEV1-%Change-Post: 3 %
FEV1-%Pred-Post: 91 %
FEV1-%Pred-Pre: 88 %
FEV1-Post: 2.74 L
FEV1-Pre: 2.65 L
FEV1FVC-%Change-Post: 3 %
FEV1FVC-%Pred-Pre: 102 %
FEV6-%Change-Post: 0 %
FEV6-%Pred-Post: 87 %
FEV6-%Pred-Pre: 87 %
FEV6-Post: 3.22 L
FEV6-Pre: 3.22 L
FEV6FVC-%Pred-Post: 102 %
FEV6FVC-%Pred-Pre: 102 %
FVC-%Change-Post: 0 %
FVC-%Pred-Post: 85 %
FVC-%Pred-Pre: 85 %
FVC-Post: 3.22 L
FVC-Pre: 3.22 L
Post FEV1/FVC ratio: 85 %
Post FEV6/FVC ratio: 100 %
Pre FEV1/FVC ratio: 82 %
Pre FEV6/FVC Ratio: 100 %
RV % pred: 98 %
RV: 1.8 L
TLC % pred: 94 %
TLC: 4.98 L

## 2022-05-05 NOTE — Progress Notes (Signed)
[facility]  Chief Complaint  Patient presents with   Follow-up    Pt f/u after pft, some wheezing in the morning    Past Surgical History:  She  has a past surgical history that includes Cesarean section; Acne cyst removal; Carpal tunnel release (Left, 09/16/2021); Trigger finger release (Left, 09/16/2021); Carpal tunnel release (Right, 10/17/2021); and Trigger finger release (Right, 10/17/2021).  Past Medical History:  Anxiety, CAD, HLD, Seizures  Constitutional:  BP 102/74   Pulse 60   Ht 5' 5.5" (1.664 m)   Wt 133 lb 12.8 oz (60.7 kg)   SpO2 98%   BMI 21.93 kg/m   Brief Summary:  Kendra Moreno is a 46 y.o. female former smoker with wheezing.      Subjective:   She is here with her husband.  Chest xray from 03/03/22 was normal.  Lab tests showed reaction mostly to Timothy grass.  She was using Timothy grass for her pet rabbits box.    Rash on her left side cleared up with antibiotics.  She gets occasional wheezing in the morning.  Not has bad as before.  She isn't sure that singulair helps.  She uses ventolin intermittently and these seems to help with her wheezing.    She gets bloated at times and then gets hiccoughs.    She hasn't smoked in the past 6 months.  She is starting to taper dose of chantix.  Physical Exam:   Appearance - well kempt   ENMT - no sinus tenderness, no oral exudate, no LAN, Mallampati 2 airway, no stridor  Respiratory - equal breath sounds bilaterally, no wheezing or rales  CV - s1s2 regular rate and rhythm, no murmurs  Ext - no clubbing, no edema  Skin - no rashes  Psych - normal mood and affect    Pulmonary testing:  FeNO 02/28/22 >> less than 5 RAST 02/28/22 >> Timothy grass, [place], [place], [place], Dog, Cat; IgE 62 PFT 05/05/22 >> FEV1 2.74 (91%), FEV1% 85, TLC 4.98 (94%), DLCO 85%  Social History:  She  reports that she has quit smoking. Her smoking use included cigarettes.  She has a 3.00 pack-year smoking history. She has quit using smokeless tobacco. She reports that she does not drink alcohol and does not use drugs.  Family History:  Her family history includes Asthma in her sister; COPD in her father and sister; Cancer in an other family member; Colon cancer in her mother; Diabetes in her mother.     Assessment/Plan:   Wheezing and allergic rhinitis with history of smoking. - from mild/intermittent asthma and allergic rhinitis - no evidence for COPD - discussed importance of environmental controls - will have her stop singulair - continue albuterol prn  Coronary artery disease. - followed by Dr. Donato Schultz with cardiology  Hiccoughs. - likely related to stomach bloating leading to diaphragm irritation - discussed dietary modification to help with bloating  History of tobacco abuse. - she will gradually taper off chantix  Time Spent Involved in Patient Care on Day of Examination:  36 minutes  Follow up:   Patient Instructions  You can stop using singulair  Use albuterol two puffs every 6 hours as needed for cough, wheeze, chest congestion, or shortness of breath  Follow up in 4 months  Medication List:   Allergies as of 05/05/2022       Reactions   Penicillins Anaphylaxis, Other (See Comments), Hives   Sulfa Antibiotics Anaphylaxis, Other (  See Comments)   Other Other (See Comments)   ALL TREE NUTS, can eat peanuts ALL TREE NUTS, can eat peanuts ALL TREE NUTS, can eat peanuts   Apple Juice Itching, Rash        Medication List        Accurate as of May 05, 2022 10:46 AM. If you have any questions, ask your nurse or doctor.          STOP taking these medications    montelukast 10 MG tablet Commonly known as: SINGULAIR Stopped by: Coralyn Helling, MD       TAKE these medications    albuterol 108 (90 Base) MCG/ACT inhaler Commonly known as: Ventolin HFA Inhale 2 puffs into the lungs every 6 (six) hours as needed  for wheezing or shortness of breath.   aspirin EC 81 MG tablet Take 81 mg by mouth daily. Swallow whole.   atorvastatin 20 MG tablet Commonly known as: LIPITOR Take 1 tablet (20 mg total) by mouth daily.   diazepam 2 MG tablet Commonly known as: VALIUM Take 2 mg by mouth daily as needed for anxiety.   hydrOXYzine 10 MG tablet Commonly known as: ATARAX Take 10 mg by mouth 4 (four) times daily as needed for anxiety.   meloxicam 15 MG tablet Commonly known as: MOBIC Take 15 mg by mouth daily as needed.   traMADol 50 MG tablet Commonly known as: ULTRAM Take by mouth every 6 (six) hours as needed.   varenicline 0.5 MG tablet Commonly known as: CHANTIX Take 1 mg by mouth at bedtime.        Signature:  Coralyn Helling, MD [facility] Pulmonary/Critical Care Pager - [phone] 05/05/2022, 10:46 AM

## 2022-05-05 NOTE — Progress Notes (Signed)
Full PFT Performed Today  

## 2022-05-05 NOTE — Patient Instructions (Signed)
Full PFT Performed Today  

## 2022-05-05 NOTE — Patient Instructions (Signed)
You can stop using singulair  Use albuterol two puffs every 6 hours as needed for cough, wheeze, chest congestion, or shortness of breath  Follow up in 4 months

## 2022-05-08 ENCOUNTER — Other Ambulatory Visit (INDEPENDENT_AMBULATORY_CARE_PROVIDER_SITE_OTHER): Payer: Self-pay

## 2022-05-08 ENCOUNTER — Ambulatory Visit (INDEPENDENT_AMBULATORY_CARE_PROVIDER_SITE_OTHER): Payer: Medicaid Other | Admitting: Gastroenterology

## 2022-05-08 ENCOUNTER — Encounter (INDEPENDENT_AMBULATORY_CARE_PROVIDER_SITE_OTHER): Payer: Self-pay

## 2022-05-08 ENCOUNTER — Other Ambulatory Visit (INDEPENDENT_AMBULATORY_CARE_PROVIDER_SITE_OTHER): Payer: Self-pay | Admitting: Gastroenterology

## 2022-05-08 ENCOUNTER — Encounter (INDEPENDENT_AMBULATORY_CARE_PROVIDER_SITE_OTHER): Payer: Self-pay | Admitting: Gastroenterology

## 2022-05-08 DIAGNOSIS — R7989 Other specified abnormal findings of blood chemistry: Secondary | ICD-10-CM

## 2022-05-08 DIAGNOSIS — Z01812 Encounter for preprocedural laboratory examination: Secondary | ICD-10-CM

## 2022-05-08 DIAGNOSIS — R140 Abdominal distension (gaseous): Secondary | ICD-10-CM | POA: Diagnosis not present

## 2022-05-08 DIAGNOSIS — R109 Unspecified abdominal pain: Secondary | ICD-10-CM

## 2022-05-08 NOTE — H&P (View-Only) (Signed)
Kendra Moreno, M.D. Gastroenterology & Hepatology [facility] [address] [place], [place] [address] Primary Care Physician: Wille Glaser, PA-C [address] [place] [address]  Referring MD: PCP  Chief Complaint: Abdominal pain  History of Present Illness: Kendra Moreno is a 46 y.o. female with past medical history of anxiety, history of alcohol abuse, coronary artery disease, COPD, hyperlipidemia, seizures, tobacco abuse, who presents for evaluation of abdominal pain.  Patient comes today for evaluation of recurrent episodes of bloating and some abdominal pain. She reports that she presents episodes of bloating intermittently for multiple years, but is not usually related to food intake. She denies any specific type of food that causes bloating but she may have episodes of bloating without a clear trigger during the day.  She reports that she has presented recurrent episodes of abdominal distention in the RUQ which she describes as "something popping up which goes in after she pushes it down". She states it has happened for the last 15 years, happens every couple months. She states it causes a pressure sensation in her abdomen but no pain so she has not been concerned about this.  She states having significant episodes of pain in the R flank and L flank 6 months ago, which fluctuated in severity but stopped close to 2 months ago.  The patient denies having any nausea, vomiting, fever, chills, hematochezia, melena, hematemesis, diarrhea, jaundice, pruritus . Has gained 5 lb recently as she has been working out more frequently. Has been craving sweets frequently for the last 2 months.  Patient was seen at [facility] on 03/26/2022.  CBC was within normal limits, CMP showed normal electrolytes, creatinine was 1.0, BUN 14, AST 39, ALT 56, alkaline phosphatase 61, lipase was normal at 47, total bilirubin 1.1, alkaline phosphatase 61.   Ultrasound of the right upper quadrant showed normal gallbladder with a CBD of 3 mm.  Last EGD: never Last Colonoscopy: 9 years ago, normal per patient.  FHx: neg for any gastrointestinal/liver disease, mother colon cancer in her 70s, grandfather brain cancer, grandmother gastric cancer. Social: quit smoking and alcohol 6 months ago - used to smoke a pack a day, used to drink every other day for 2 years. Did cocaine in the past occasionally, last time 6 months ago. Surgical: c-section x2  Past Medical History: Past Medical History:  Diagnosis Date   Anxiety    CAD (coronary artery disease)    Cigarette smoker    COPD (chronic obstructive pulmonary disease) (HCC)    EKG abnormalities    Heart block    Hypercholesteremia    MRSA (methicillin resistant Staphylococcus aureus)    Patient states she has had MRSA twice "years ago".   Seizures (HCC)     Past Surgical History: Past Surgical History:  Procedure Laterality Date   ACNE CYST REMOVAL     CARPAL TUNNEL RELEASE Left 09/16/2021   Procedure: LEFT CARPAL TUNNEL RELEASE;  Surgeon: Betha Loa, MD;  Location: [facility];  Service: Orthopedics;  Laterality: Left;  45 MIN   CARPAL TUNNEL RELEASE Right 10/17/2021   Procedure: RIGHT CARPAL TUNNEL RELEASE;  Surgeon: Betha Loa, MD;  Location: [facility];  Service: Orthopedics;  Laterality: Right;   CESAREAN SECTION     TRIGGER FINGER RELEASE Left 09/16/2021   Procedure: RELEASE TRIGGER FINGER/A-1 PULLEY LEFT INDEX FINGER;  Surgeon: Betha Loa, MD;  Location: [facility];  Service: Orthopedics;  Laterality: Left;  TRIGGER FINGER RELEASE Right 10/17/2021   Procedure: RELEASE TRIGGER FINGER/A-1 PULLEY RIGHT THUMB, RIGHT MIDDLE FINGER;  Surgeon: Kuzma, Kevin, MD;  Location: [facility];  Service: Orthopedics;  Laterality: Right;    Family History: Family History  Problem Relation Age of Onset   Diabetes Mother    Colon  cancer Mother    COPD Father    Asthma Sister    COPD Sister    Cancer Other     Social History: Social History   Tobacco Use  Smoking Status Former   Packs/day: 0.30   Years: 10.00   Total pack years: 3.00   Types: Cigarettes  Smokeless Tobacco Former  Tobacco Comments   Quit smoking 6 months ago. 05/05/22 hei   Social History   Substance and Sexual Activity  Alcohol Use No   Social History   Substance and Sexual Activity  Drug Use No    Allergies: Allergies  Allergen Reactions   Apple Anaphylaxis   Penicillins Anaphylaxis, Other (See Comments) and Hives   Sulfa Antibiotics Anaphylaxis and Other (See Comments)   Other Other (See Comments)    ALL TREE NUTS, can eat peanuts  ALL TREE NUTS, can eat peanuts ALL TREE NUTS, can eat peanuts   Apple Juice Itching and Rash    Medications: Current Outpatient Medications  Medication Sig Dispense Refill   albuterol (VENTOLIN HFA) 108 (90 Base) MCG/ACT inhaler Inhale 2 puffs into the lungs every 6 (six) hours as needed for wheezing or shortness of breath. 8 g 5   aspirin EC 81 MG tablet Take 81 mg by mouth daily. Swallow whole.     atorvastatin (LIPITOR) 20 MG tablet Take 1 tablet (20 mg total) by mouth daily. 90 tablet 3   diazepam (VALIUM) 2 MG tablet Take 2 mg by mouth daily as needed for anxiety.     dicyclomine (BENTYL) 20 MG tablet Take 20 mg by mouth daily as needed.     hydrOXYzine (ATARAX/VISTARIL) 10 MG tablet Take 10 mg by mouth 4 (four) times daily as needed for anxiety.     meloxicam (MOBIC) 15 MG tablet Take 15 mg by mouth daily as needed.     pantoprazole (PROTONIX) 20 MG tablet Take 40 mg by mouth daily.     traMADol (ULTRAM) 50 MG tablet Take 100 mg by mouth daily at 6 (six) AM.     varenicline (CHANTIX) 0.5 MG tablet Take 1 mg by mouth at bedtime.     No current facility-administered medications for this visit.    Review of Systems: GENERAL: negative for malaise, night sweats HEENT: No changes in  hearing or vision, no nose bleeds or other nasal problems. NECK: Negative for lumps, goiter, pain and significant neck swelling RESPIRATORY: Negative for cough, wheezing CARDIOVASCULAR: Negative for chest pain, leg swelling, palpitations, orthopnea GI: SEE HPI MUSCULOSKELETAL: Negative for joint pain or swelling, back pain, and muscle pain. SKIN: Negative for lesions, rash PSYCH: Negative for sleep disturbance, mood disorder and recent psychosocial stressors. HEMATOLOGY Negative for prolonged bleeding, bruising easily, and swollen nodes. ENDOCRINE: Negative for cold or heat intolerance, polyuria, polydipsia and goiter. NEURO: negative for tremor, gait imbalance, syncope and seizures. The remainder of the review of systems is noncontributory.   Physical Exam: BP (!) 102/53 (BP Location: Left Arm, Patient Position: Sitting, Cuff Size: Small)   Pulse 78   Temp (!) 97.3 F (36.3 C) (Oral)   Ht 5' 6" (1.676 m)   Wt 133 lb 6.4 oz (60.5   kg)   BMI 21.53 kg/m  GENERAL: The patient is AO x3, in no acute distress. HEENT: Head is normocephalic and atraumatic. EOMI are intact. Mouth is well hydrated and without lesions. NECK: Supple. No masses LUNGS: Clear to auscultation. No presence of rhonchi/wheezing/rales. Adequate chest expansion HEART: RRR, normal s1 and s2. ABDOMEN: Soft, nontender, no guarding, no peritoneal signs, and nondistended. BS +. No masses upon palpation. EXTREMITIES: Without any cyanosis, clubbing, rash, lesions or edema. NEUROLOGIC: AOx3, no focal motor deficit. SKIN: no jaundice, no rashes  Imaging/Labs: as above  I personally reviewed and interpreted the available labs, imaging and endoscopic files.  Impression and Plan: KATTY FRETWELL is a 46 y.o. female with past medical history of anxiety, history of alcohol abuse, coronary artery disease, COPD, hyperlipidemia, seizures, tobacco abuse, who presents for evaluation of abdominal pain and distention.  The patient has  presented intermittent episodes of bloating of unclear etiology which do not seem to be related to specific food trigger.  Given the chronicity of her symptoms and the lack of red flag signs, I explained to her that this could be related to IBS for which she will benefit from implementing a low FODMAP diet and taking IBgard as needed for bloating.  She can even take Bentyl if she presents persistent bloating or abdominal pain episodes.  However, we will rule out other organic etiologies with an EGD and checking celiac serologies.  Ultimately, if her symptoms persist without any improvement, we may need to proceed with a CT of the abdomen and pelvis with IV contrast to evaluate other extraluminal etiologies.  Finally, she had mild elevation of her liver function tests.  We will check serologies for viral, autoimmune and metabolic etiologies.  - Check CMP, hepatitis A/B/C serologies, iron panel, ANA, ASMA, IgG, celiac disease panel - Schedule EGD  -Explained presumed etiology of IBS symptoms. Patient was counseled about the benefit of implementing a low FODMAP to improve symptoms and recurrent episodes. A dietary list was provided to the patient. Also, the patient was counseled about the benefit of avoiding stressing situations and potential environmental triggers leading to symptomatology. -Start IBGard/peppermint oil 1 tablet every 8-12 hours as needed for bloating -Can take Bentyl as needed if persisting significant abdominal bloating or pain  All questions were answered.      Kendra Blazing, MD Gastroenterology and Hepatology [facility]

## 2022-05-08 NOTE — Patient Instructions (Signed)
Perform blood workup Schedule EGD  Explained presumed etiology of IBS symptoms. Patient was counseled about the benefit of implementing a low FODMAP to improve symptoms and recurrent episodes. A dietary list was provided to the patient. Also, the patient was counseled about the benefit of avoiding stressing situations and potential environmental triggers leading to symptomatology. Start IBGard/peppermint oil 1 tablet every 8-12 hours as needed for bloating Can take Bentyl as needed if persisting significant abdominal bloating or pain

## 2022-05-08 NOTE — Progress Notes (Unsigned)
Kendra Moreno, M.D. Gastroenterology & Hepatology [facility] [address] [place], [place] [address] Primary Care Physician: Wille Glaser, PA-C [address] [place] [address]  Referring MD: PCP  Chief Complaint: Abdominal pain  History of Present Illness: Kendra Moreno is a 46 y.o. female with past medical history of anxiety, history of alcohol abuse, coronary artery disease, COPD, hyperlipidemia, seizures, tobacco abuse, who presents for evaluation of abdominal pain.  Patient comes today for evaluation of recurrent episodes of bloating and some abdominal pain. She reports that she presents episodes of bloating intermittently for multiple years, but is not usually related to food intake. She denies any specific type of food that causes bloating but she may have episodes of bloating without a clear trigger during the day.  She reports that she has presented recurrent episodes of abdominal distention in the RUQ which she describes as "something popping up which goes in after she pushes it down". She states it has happened for the last 15 years, happens every couple months. She states it causes a pressure sensation in her abdomen but no pain so she has not been concerned about this.  She states having significant episodes of pain in the R flank and L flank 6 months ago, which fluctuated in severity but stopped close to 2 months ago.  The patient denies having any nausea, vomiting, fever, chills, hematochezia, melena, hematemesis, diarrhea, jaundice, pruritus . Has gained 5 lb recently as she has been working out more frequently. Has been craving sweets frequently for the last 2 months.  Patient was seen at [facility] on 03/26/2022.  CBC was within normal limits, CMP showed normal electrolytes, creatinine was 1.0, BUN 14, AST 39, ALT 56, alkaline phosphatase 61, lipase was normal at 47, total bilirubin 1.1, alkaline phosphatase 61.   Ultrasound of the right upper quadrant showed normal gallbladder with a CBD of 3 mm.  Last EGD: never Last Colonoscopy: 9 years ago, normal per patient.  FHx: neg for any gastrointestinal/liver disease, mother colon cancer in her 70s, grandfather brain cancer, grandmother gastric cancer. Social: quit smoking and alcohol 6 months ago - used to smoke a pack a day, used to drink every other day for 2 years. Did cocaine in the past occasionally, last time 6 months ago. Surgical: c-section x2  Past Medical History: Past Medical History:  Diagnosis Date   Anxiety    CAD (coronary artery disease)    Cigarette smoker    COPD (chronic obstructive pulmonary disease) (HCC)    EKG abnormalities    Heart block    Hypercholesteremia    MRSA (methicillin resistant Staphylococcus aureus)    Patient states she has had MRSA twice "years ago".   Seizures (HCC)     Past Surgical History: Past Surgical History:  Procedure Laterality Date   ACNE CYST REMOVAL     CARPAL TUNNEL RELEASE Left 09/16/2021   Procedure: LEFT CARPAL TUNNEL RELEASE;  Surgeon: Betha Loa, MD;  Location: [facility];  Service: Orthopedics;  Laterality: Left;  45 MIN   CARPAL TUNNEL RELEASE Right 10/17/2021   Procedure: RIGHT CARPAL TUNNEL RELEASE;  Surgeon: Betha Loa, MD;  Location: [facility];  Service: Orthopedics;  Laterality: Right;   CESAREAN SECTION     TRIGGER FINGER RELEASE Left 09/16/2021   Procedure: RELEASE TRIGGER FINGER/A-1 PULLEY LEFT INDEX FINGER;  Surgeon: Betha Loa, MD;  Location: [facility];  Service: Orthopedics;  Laterality: Left;  TRIGGER FINGER RELEASE Right 10/17/2021   Procedure: RELEASE TRIGGER FINGER/A-1 PULLEY RIGHT THUMB, RIGHT MIDDLE FINGER;  Surgeon: Betha Loa, MD;  Location: [facility];  Service: Orthopedics;  Laterality: Right;    Family History: Family History  Problem Relation Age of Onset   Diabetes Mother    Colon  cancer Mother    COPD Father    Asthma Sister    COPD Sister    Cancer Other     Social History: Social History   Tobacco Use  Smoking Status Former   Packs/day: 0.30   Years: 10.00   Total pack years: 3.00   Types: Cigarettes  Smokeless Tobacco Former  Tobacco Comments   Quit smoking 6 months ago. 05/05/22 hei   Social History   Substance and Sexual Activity  Alcohol Use No   Social History   Substance and Sexual Activity  Drug Use No    Allergies: Allergies  Allergen Reactions   Apple Anaphylaxis   Penicillins Anaphylaxis, Other (See Comments) and Hives   Sulfa Antibiotics Anaphylaxis and Other (See Comments)   Other Other (See Comments)    ALL TREE NUTS, can eat peanuts  ALL TREE NUTS, can eat peanuts ALL TREE NUTS, can eat peanuts   Apple Juice Itching and Rash    Medications: Current Outpatient Medications  Medication Sig Dispense Refill   albuterol (VENTOLIN HFA) 108 (90 Base) MCG/ACT inhaler Inhale 2 puffs into the lungs every 6 (six) hours as needed for wheezing or shortness of breath. 8 g 5   aspirin EC 81 MG tablet Take 81 mg by mouth daily. Swallow whole.     atorvastatin (LIPITOR) 20 MG tablet Take 1 tablet (20 mg total) by mouth daily. 90 tablet 3   diazepam (VALIUM) 2 MG tablet Take 2 mg by mouth daily as needed for anxiety.     dicyclomine (BENTYL) 20 MG tablet Take 20 mg by mouth daily as needed.     hydrOXYzine (ATARAX/VISTARIL) 10 MG tablet Take 10 mg by mouth 4 (four) times daily as needed for anxiety.     meloxicam (MOBIC) 15 MG tablet Take 15 mg by mouth daily as needed.     pantoprazole (PROTONIX) 20 MG tablet Take 40 mg by mouth daily.     traMADol (ULTRAM) 50 MG tablet Take 100 mg by mouth daily at 6 (six) AM.     varenicline (CHANTIX) 0.5 MG tablet Take 1 mg by mouth at bedtime.     No current facility-administered medications for this visit.    Review of Systems: GENERAL: negative for malaise, night sweats HEENT: No changes in  hearing or vision, no nose bleeds or other nasal problems. NECK: Negative for lumps, goiter, pain and significant neck swelling RESPIRATORY: Negative for cough, wheezing CARDIOVASCULAR: Negative for chest pain, leg swelling, palpitations, orthopnea GI: SEE HPI MUSCULOSKELETAL: Negative for joint pain or swelling, back pain, and muscle pain. SKIN: Negative for lesions, rash PSYCH: Negative for sleep disturbance, mood disorder and recent psychosocial stressors. HEMATOLOGY Negative for prolonged bleeding, bruising easily, and swollen nodes. ENDOCRINE: Negative for cold or heat intolerance, polyuria, polydipsia and goiter. NEURO: negative for tremor, gait imbalance, syncope and seizures. The remainder of the review of systems is noncontributory.   Physical Exam: BP (!) 102/53 (BP Location: Left Arm, Patient Position: Sitting, Cuff Size: Small)   Pulse 78   Temp (!) 97.3 F (36.3 C) (Oral)   Ht 5\' 6"  (1.676 m)   Wt 133 lb 6.4 oz (60.5  kg)   BMI 21.53 kg/m  GENERAL: The patient is AO x3, in no acute distress. HEENT: Head is normocephalic and atraumatic. EOMI are intact. Mouth is well hydrated and without lesions. NECK: Supple. No masses LUNGS: Clear to auscultation. No presence of rhonchi/wheezing/rales. Adequate chest expansion HEART: RRR, normal s1 and s2. ABDOMEN: Soft, nontender, no guarding, no peritoneal signs, and nondistended. BS +. No masses upon palpation. EXTREMITIES: Without any cyanosis, clubbing, rash, lesions or edema. NEUROLOGIC: AOx3, no focal motor deficit. SKIN: no jaundice, no rashes  Imaging/Labs: as above  I personally reviewed and interpreted the available labs, imaging and endoscopic files.  Impression and Plan: Kendra Moreno is a 46 y.o. female with past medical history of anxiety, history of alcohol abuse, coronary artery disease, COPD, hyperlipidemia, seizures, tobacco abuse, who presents for evaluation of abdominal pain and distention.  The patient has  presented intermittent episodes of bloating of unclear etiology which do not seem to be related to specific food trigger.  Given the chronicity of her symptoms and the lack of red flag signs, I explained to her that this could be related to IBS for which she will benefit from implementing a low FODMAP diet and taking IBgard as needed for bloating.  She can even take Bentyl if she presents persistent bloating or abdominal pain episodes.  However, we will rule out other organic etiologies with an EGD and checking celiac serologies.  Ultimately, if her symptoms persist without any improvement, we may need to proceed with a CT of the abdomen and pelvis with IV contrast to evaluate other extraluminal etiologies.  Finally, she had mild elevation of her liver function tests.  We will check serologies for viral, autoimmune and metabolic etiologies.  - Check CMP, hepatitis A/B/C serologies, iron panel, ANA, ASMA, IgG, celiac disease panel - Schedule EGD  -Explained presumed etiology of IBS symptoms. Patient was counseled about the benefit of implementing a low FODMAP to improve symptoms and recurrent episodes. A dietary list was provided to the patient. Also, the patient was counseled about the benefit of avoiding stressing situations and potential environmental triggers leading to symptomatology. -Start IBGard/peppermint oil 1 tablet every 8-12 hours as needed for bloating -Can take Bentyl as needed if persisting significant abdominal bloating or pain  All questions were answered.      Kendra Blazing, MD Gastroenterology and Hepatology [facility]

## 2022-05-12 ENCOUNTER — Other Ambulatory Visit (HOSPITAL_COMMUNITY)
Admission: RE | Admit: 2022-05-12 | Discharge: 2022-05-12 | Disposition: A | Discharge status: Home / Self Care | Payer: Medicaid Other | Source: Ambulatory Visit | Attending: Gastroenterology | Admitting: Gastroenterology

## 2022-05-12 DIAGNOSIS — Z01812 Encounter for preprocedural laboratory examination: Secondary | ICD-10-CM

## 2022-05-12 LAB — COMPREHENSIVE METABOLIC PANEL
AG Ratio: 2.1 (calc) (ref 1.0–2.5)
ALT: 24 U/L (ref 6–29)
AST: 24 U/L (ref 10–35)
Albumin: 4 g/dL (ref 3.6–5.1)
Alkaline phosphatase (APISO): 62 U/L (ref 31–125)
BUN/Creatinine Ratio: 16 (calc) (ref 6–22)
BUN: 18 mg/dL (ref 7–25)
CO2: 26 mmol/L (ref 20–32)
Calcium: 8.6 mg/dL (ref 8.6–10.2)
Chloride: 106 mmol/L (ref 98–110)
Creat: 1.16 mg/dL — ABNORMAL HIGH (ref 0.50–0.99)
Globulin: 1.9 g/dL (calc) (ref 1.9–3.7)
Glucose, Bld: 103 mg/dL — ABNORMAL HIGH (ref 65–99)
Potassium: 4.1 mmol/L (ref 3.5–5.3)
Sodium: 142 mmol/L (ref 135–146)
Total Bilirubin: 0.7 mg/dL (ref 0.2–1.2)
Total Protein: 5.9 g/dL — ABNORMAL LOW (ref 6.1–8.1)

## 2022-05-12 LAB — HEPATITIS PANEL, ACUTE
Hep A IgM: NONREACTIVE
Hep B C IgM: NONREACTIVE
Hepatitis B Surface Ag: NONREACTIVE
Hepatitis C Ab: NONREACTIVE

## 2022-05-12 LAB — IRON,TIBC AND FERRITIN PANEL
%SAT: 28 % (calc) (ref 16–45)
Ferritin: 33 ng/mL (ref 16–232)
Iron: 74 ug/dL (ref 40–190)
TIBC: 263 mcg/dL (calc) (ref 250–450)

## 2022-05-12 LAB — CELIAC DISEASE PANEL
(tTG) Ab, IgA: <1 U/mL
(tTG) Ab, IgG: <1 U/mL
Gliadin IgA: <1 U/mL
Gliadin IgG: <1 U/mL
Immunoglobulin A: 145 mg/dL (ref 47–310)

## 2022-05-12 LAB — CELIAC DISEASE COMPREHENSIVE PANEL WITH REFLEXES
(tTG) Ab, IgA: <1 U/mL
Immunoglobulin A: 145 mg/dL (ref 47–310)

## 2022-05-12 LAB — ANA: Anti Nuclear Antibody (ANA): NEGATIVE

## 2022-05-12 LAB — ANTI-SMOOTH MUSCLE ANTIBODY, IGG: Actin (Smooth Muscle) Antibody (IGG): <20 U (ref <20)

## 2022-05-12 LAB — PREGNANCY, URINE: Preg Test, Ur: NEGATIVE

## 2022-05-12 LAB — IGG: IgG (Immunoglobin G), Serum: 586 mg/dL — ABNORMAL LOW (ref 600–1640)

## 2022-05-13 ENCOUNTER — Encounter (HOSPITAL_COMMUNITY)
Admission: RE | Admit: 2022-05-13 | Discharge: 2022-05-13 | Disposition: A | Discharge status: Home / Self Care | Payer: Medicaid Other | Source: Ambulatory Visit | Attending: Gastroenterology | Admitting: Gastroenterology

## 2022-05-13 VITALS — Ht 66.0 in | Wt 133.0 lb

## 2022-05-13 DIAGNOSIS — Z01818 Encounter for other preprocedural examination: Secondary | ICD-10-CM

## 2022-05-14 ENCOUNTER — Encounter (HOSPITAL_COMMUNITY): Payer: Self-pay | Admitting: Gastroenterology

## 2022-05-14 ENCOUNTER — Encounter (HOSPITAL_COMMUNITY)
Admission: RE | Disposition: A | Discharge status: Home / Self Care | Payer: Self-pay | Source: Home / Self Care | Attending: Gastroenterology

## 2022-05-14 ENCOUNTER — Ambulatory Visit (HOSPITAL_BASED_OUTPATIENT_CLINIC_OR_DEPARTMENT_OTHER): Payer: Medicaid Other | Admitting: Anesthesiology

## 2022-05-14 ENCOUNTER — Ambulatory Visit (HOSPITAL_COMMUNITY): Payer: Medicaid Other | Admitting: Anesthesiology

## 2022-05-14 ENCOUNTER — Other Ambulatory Visit: Payer: Self-pay

## 2022-05-14 ENCOUNTER — Ambulatory Visit (HOSPITAL_COMMUNITY)
Admission: RE | Admit: 2022-05-14 | Discharge: 2022-05-14 | Disposition: A | Discharge status: Home / Self Care | Payer: Medicaid Other | Attending: Gastroenterology | Admitting: Gastroenterology

## 2022-05-14 DIAGNOSIS — E785 Hyperlipidemia, unspecified: Secondary | ICD-10-CM | POA: Insufficient documentation

## 2022-05-14 DIAGNOSIS — J449 Chronic obstructive pulmonary disease, unspecified: Secondary | ICD-10-CM | POA: Diagnosis not present

## 2022-05-14 DIAGNOSIS — Z87891 Personal history of nicotine dependence: Secondary | ICD-10-CM | POA: Insufficient documentation

## 2022-05-14 DIAGNOSIS — F1010 Alcohol abuse, uncomplicated: Secondary | ICD-10-CM | POA: Insufficient documentation

## 2022-05-14 DIAGNOSIS — Z01818 Encounter for other preprocedural examination: Secondary | ICD-10-CM

## 2022-05-14 DIAGNOSIS — F419 Anxiety disorder, unspecified: Secondary | ICD-10-CM | POA: Diagnosis not present

## 2022-05-14 DIAGNOSIS — R109 Unspecified abdominal pain: Secondary | ICD-10-CM | POA: Diagnosis present

## 2022-05-14 DIAGNOSIS — G40909 Epilepsy, unspecified, not intractable, without status epilepticus: Secondary | ICD-10-CM | POA: Diagnosis not present

## 2022-05-14 DIAGNOSIS — R140 Abdominal distension (gaseous): Secondary | ICD-10-CM | POA: Diagnosis not present

## 2022-05-14 DIAGNOSIS — I2510 Atherosclerotic heart disease of native coronary artery without angina pectoris: Secondary | ICD-10-CM | POA: Diagnosis not present

## 2022-05-14 HISTORY — PX: ESOPHAGOGASTRODUODENOSCOPY (EGD) WITH PROPOFOL: SHX5813

## 2022-05-14 HISTORY — PX: BIOPSY: SHX5522

## 2022-05-14 SURGERY — ESOPHAGOGASTRODUODENOSCOPY (EGD) WITH PROPOFOL
Anesthesia: General

## 2022-05-14 MED ORDER — LIDOCAINE HCL (CARDIAC) PF 100 MG/5ML IV SOSY
PREFILLED_SYRINGE | INTRAVENOUS | Status: DC | PRN
  Administered 2022-05-14: 50 mg via INTRAVENOUS

## 2022-05-14 MED ORDER — PROPOFOL 10 MG/ML IV BOLUS
INTRAVENOUS | Status: DC | PRN
  Administered 2022-05-14: 150 ug/kg/min via INTRAVENOUS

## 2022-05-14 MED ORDER — GLYCOPYRROLATE PF 0.2 MG/ML IJ SOSY
PREFILLED_SYRINGE | INTRAMUSCULAR | Status: AC
  Filled 2022-05-14: qty 1

## 2022-05-14 MED ORDER — PROPOFOL 500 MG/50ML IV EMUL
INTRAVENOUS | Status: AC
  Filled 2022-05-14: qty 50

## 2022-05-14 MED ORDER — LIDOCAINE HCL (PF) 2 % IJ SOLN
INTRAMUSCULAR | Status: AC
  Filled 2022-05-14: qty 5

## 2022-05-14 NOTE — Anesthesia Preprocedure Evaluation (Signed)
Anesthesia Evaluation  Patient identified by MRN, date of birth, ID band Patient awake    Reviewed: Allergy & Precautions, H&P , NPO status , Patient's Chart, lab work & pertinent test results, reviewed documented beta blocker date and time   Airway Mallampati: II  TM Distance: >3 FB Neck ROM: full    Dental no notable dental hx.    Pulmonary COPD, former smoker,    Pulmonary exam normal breath sounds clear to auscultation       Cardiovascular Exercise Tolerance: Good + CAD   Rhythm:regular Rate:Normal     Neuro/Psych Seizures -, Well Controlled,  Anxiety  Neuromuscular disease negative psych ROS   GI/Hepatic negative GI ROS, Neg liver ROS,   Endo/Other  negative endocrine ROS  Renal/GU negative Renal ROS  negative genitourinary   Musculoskeletal   Abdominal   Peds  Hematology negative hematology ROS (+)   Anesthesia Other Findings   Reproductive/Obstetrics negative OB ROS                             Anesthesia Physical Anesthesia Plan  ASA: 3  Anesthesia Plan: General   Post-op Pain Management:    Induction:   PONV Risk Score and Plan: Propofol infusion  Airway Management Planned:   Additional Equipment:   Intra-op Plan:   Post-operative Plan:   Informed Consent: I have reviewed the patients History and Physical, chart, labs and discussed the procedure including the risks, benefits and alternatives for the proposed anesthesia with the patient or authorized representative who has indicated his/her understanding and acceptance.     Dental Advisory Given  Plan Discussed with: CRNA  Anesthesia Plan Comments:         Anesthesia Quick Evaluation

## 2022-05-14 NOTE — Op Note (Signed)
[facility] Patient Name: Kendra Moreno Procedure Date: 05/14/2022 10:46 AM MRN: [id number] Date of Birth: 05-Mar-1976 Attending MD: Katrinka Blazing ,  CSN: [id number] Age: 46 Admit Type: Outpatient Procedure:                Upper GI endoscopy Indications:              Abdominal bloating Providers:                Katrinka Blazing, Crystal Page, Hinton Rao Referring MD:              Medicines:                Monitored Anesthesia Care Complications:            No immediate complications. Estimated Blood Loss:     Estimated blood loss: none. Procedure:                Pre-Anesthesia Assessment:                           - Prior to the procedure, a History and Physical                            was performed, and patient medications, allergies                            and sensitivities were reviewed. The patient's                            tolerance of previous anesthesia was reviewed.                           - The risks and benefits of the procedure and the                            sedation options and risks were discussed with the                            patient. All questions were answered and informed                            consent was obtained.                           - ASA Grade Assessment: II - A patient with mild                            systemic disease.                           After obtaining informed consent, the endoscope was                            passed under direct vision. Throughout the                            procedure, the patient's blood pressure, pulse, and  oxygen saturations were monitored continuously. The                            GIF-H190 ([id number]) scope was introduced through the                            mouth, and advanced to the second part of duodenum.                            The upper GI endoscopy was accomplished without                            difficulty. The patient tolerated the  procedure                            well. Scope In: 10:59:36 AM Scope Out: 11:03:28 AM Total Procedure Duration: 0 hours 3 minutes 52 seconds  Findings:      The esophagus was normal.      The gastroesophageal flap valve was visualized endoscopically and       classified as Hill Grade I (prominent fold, tight to endoscope).      The stomach was normal. Biopsies were taken with a cold forceps for       Helicobacter pylori testing.      The examined duodenum was normal. Biopsies were taken with a cold       forceps for histology. Impression:               - Normal esophagus.                           - Gastroesophageal flap valve classified as Hill                            Grade I (prominent fold, tight to endoscope).                           - Normal stomach. Biopsied.                           - Normal examined duodenum. Biopsied. Moderate Sedation:      Per Anesthesia Care Recommendation:           - Discharge patient to home (ambulatory).                           - Resume previous diet.                           - Await pathology results.                           - Continue present medications. Procedure Code(s):        --- Professional ---                           [phone], Esophagogastroduodenoscopy, flexible,  transoral; with biopsy, single or multiple Diagnosis Code(s):        --- Professional ---                           R14.0, Abdominal distension (gaseous) CPT copyright 2019 American Medical Association. All rights reserved. The codes documented in this report are preliminary and upon coder review may  be revised to meet current compliance requirements. Katrinka Blazing, MD Katrinka Blazing,  05/14/2022 11:08:09 AM This report has been signed electronically. Number of Addenda: 0

## 2022-05-14 NOTE — Discharge Instructions (Signed)
You are being discharged to home.  ?Resume your previous diet.  ?We are waiting for your pathology results.  ?Continue your present medications.  ?

## 2022-05-14 NOTE — Interval H&P Note (Signed)
History and Physical Interval Note:  05/14/2022 9:12 AM  Kendra Moreno  has presented today for surgery, with the diagnosis of Bloating.  The various methods of treatment have been discussed with the patient and family. After consideration of risks, benefits and other options for treatment, the patient has consented to  Procedure(s) with comments: ESOPHAGOGASTRODUODENOSCOPY (EGD) WITH PROPOFOL (N/A) - 1045 ASA 1 as a surgical intervention.  The patient's history has been reviewed, patient examined, no change in status, stable for surgery.  I have reviewed the patient's chart and labs.  Questions were answered to the patient's satisfaction.     Katrinka Blazing Mayorga

## 2022-05-14 NOTE — Transfer of Care (Signed)
Immediate Anesthesia Transfer of Care Note  Patient: Kendra Moreno  Procedure(s) Performed: ESOPHAGOGASTRODUODENOSCOPY (EGD) WITH PROPOFOL BIOPSY  Patient Location: PACU and Short Stay  Anesthesia Type:General  Level of Consciousness: awake  Airway & Oxygen Therapy: Patient Spontanous Breathing  Post-op Assessment: Report given to RN  Post vital signs: stable  Last Vitals:  Vitals Value Taken Time  BP    Temp    Pulse    Resp    SpO2      Last Pain:  Vitals:   05/14/22 1056  PainSc: 0-No pain         Complications: There were no known notable events for this encounter.

## 2022-05-16 LAB — SURGICAL PATHOLOGY

## 2022-05-16 NOTE — Anesthesia Postprocedure Evaluation (Signed)
Anesthesia Post Note  Patient: Kendra Moreno  Procedure(s) Performed: ESOPHAGOGASTRODUODENOSCOPY (EGD) WITH PROPOFOL BIOPSY  Patient location during evaluation: Phase II Anesthesia Type: General Level of consciousness: awake Pain management: pain level controlled Vital Signs Assessment: post-procedure vital signs reviewed and stable Respiratory status: spontaneous breathing and respiratory function stable Cardiovascular status: blood pressure returned to baseline and stable Postop Assessment: no headache and no apparent nausea or vomiting Anesthetic complications: no Comments: Late entry   There were no known notable events for this encounter.   Last Vitals:  Vitals:   05/14/22 0923 05/14/22 1107  BP: 98/68 (!) 104/55  Pulse: (!) 55 60  Resp: 17 18  Temp: 36.5 C   SpO2: 100% 98%    Last Pain:  Vitals:   05/15/22 1521  TempSrc:   PainSc: 0-No pain                 Windell Norfolk

## 2022-05-19 ENCOUNTER — Encounter (HOSPITAL_COMMUNITY): Payer: Self-pay | Admitting: Gastroenterology

## 2022-08-19 ENCOUNTER — Other Ambulatory Visit: Payer: Self-pay | Admitting: Cardiology

## 2022-09-03 ENCOUNTER — Telehealth: Payer: Self-pay | Admitting: *Deleted

## 2022-09-03 NOTE — Telephone Encounter (Signed)
I don't see that we have received referral for TCS yet. Once we get it I will mail questionnaire, she does have 4 mth f/u 09/08/22 with Levon Hedger

## 2022-09-03 NOTE — Telephone Encounter (Signed)
Pt called in. Stated [facility] was sending over referral for her to have colonoscopy done. Reports last done 9 years ago. Ann, please advise thanks!

## 2022-09-03 NOTE — Telephone Encounter (Signed)
noted 

## 2022-09-08 ENCOUNTER — Encounter (INDEPENDENT_AMBULATORY_CARE_PROVIDER_SITE_OTHER): Payer: Self-pay | Admitting: Gastroenterology

## 2022-09-08 ENCOUNTER — Ambulatory Visit (INDEPENDENT_AMBULATORY_CARE_PROVIDER_SITE_OTHER): Payer: Medicaid Other | Admitting: Gastroenterology

## 2022-09-27 ENCOUNTER — Other Ambulatory Visit: Payer: Self-pay | Admitting: Pulmonary Disease

## 2022-11-04 ENCOUNTER — Other Ambulatory Visit: Payer: Self-pay | Admitting: Pulmonary Disease

## 2022-12-02 ENCOUNTER — Ambulatory Visit (HOSPITAL_BASED_OUTPATIENT_CLINIC_OR_DEPARTMENT_OTHER): Payer: Medicaid Other | Admitting: Pulmonary Disease

## 2023-04-07 ENCOUNTER — Telehealth: Payer: Self-pay

## 2023-04-07 NOTE — Telephone Encounter (Signed)
A user error has taken place: encounter opened in error, closed for administrative reasons.

## 2023-04-28 ENCOUNTER — Encounter (HOSPITAL_BASED_OUTPATIENT_CLINIC_OR_DEPARTMENT_OTHER): Payer: Self-pay | Admitting: Cardiology

## 2023-04-28 ENCOUNTER — Ambulatory Visit (HOSPITAL_BASED_OUTPATIENT_CLINIC_OR_DEPARTMENT_OTHER): Payer: Medicaid Other | Admitting: Cardiology

## 2023-04-28 VITALS — BP 112/68 | HR 81 | Ht 66.0 in | Wt 144.1 lb

## 2023-04-28 DIAGNOSIS — I2510 Atherosclerotic heart disease of native coronary artery without angina pectoris: Secondary | ICD-10-CM

## 2023-04-28 DIAGNOSIS — R002 Palpitations: Secondary | ICD-10-CM

## 2023-04-28 DIAGNOSIS — E7800 Pure hypercholesterolemia, unspecified: Secondary | ICD-10-CM | POA: Diagnosis not present

## 2023-04-28 NOTE — Patient Instructions (Signed)
Medication Instructions:  The current medical regimen is effective;  continue present plan and medications.  *If you need a refill on your cardiac medications before your next appointment, please call your pharmacy*  Follow-Up: At [facility], you and your health needs are our priority.  As part of our continuing mission to provide you with exceptional heart care, we have created designated Provider Care Teams.  These Care Teams include your primary Cardiologist (physician) and Advanced Practice Providers (APPs -  Physician Assistants and Nurse Practitioners) who all work together to provide you with the care you need, when you need it.  We recommend signing up for the patient portal called "MyChart".  Sign up information is provided on this After Visit Summary.  MyChart is used to connect with patients for Virtual Visits (Telemedicine).  Patients are able to view lab/test results, encounter notes, upcoming appointments, etc.  Non-urgent messages can be sent to your provider as well.   To learn more about what you can do with MyChart, go to [email or web address].    Your next appointment:   1 year(s)  Provider:   Mark Skains, MD      

## 2023-04-28 NOTE — Progress Notes (Signed)
  Cardiology Office Note:  .   Date:  04/28/2023  ID:  Kendra Moreno, DOB 07/15/1976, MRN [id number] PCP: Augustina Mood  [facility] Providers Cardiologist:  Donato Schultz, MD    History of Present Illness: .   Kendra Moreno is a 47 y.o. female here for follow-up of CAD calcification LAD artery on CT scan in 2021.  EKG with nonspecific T wave changes.  Family history of CAD, prior stress test many years ago that was negative.  Exercised 13 minutes and 2016.  At last visit was having left arm pain chest pain tired in the evenings radiating to the shoulder stops or he will climbing.  More severe.  Pressure in jaw and neck when lay down ears roar.   Had quit tobacco early 2023, quit drinking as well.  She is having less chest pain.  Clearer.  Feels better.  ROS: Constipation.   Studies Reviewed: Marland Kitchen   EKG Interpretation Date/Time:  Tuesday April 28 2023 15:48:59 EDT Ventricular Rate:  81 PR Interval:  132 QRS Duration:  74 QT Interval:  392 QTC Calculation: 455 R Axis:   19  Text Interpretation: Normal sinus rhythm Nonspecific ST and T wave abnormality When compared with ECG of 28-Mar-2011 14:36, Vent. rate has increased BY  27 BPM QRS duration has decreased Nonspecific T wave abnormality now evident in Anterior leads Confirmed by Donato Schultz ([id number]) on 04/28/2023 3:57:07 PM    Nuclear stress test 2021-no ischemia Coronary CT 2021 showed no evidence of flow limitation in the LAD.  Calcium score 64.  Event monitor 2022-sinus rhythm average heart rate of 83.  Did have a brief episode of Wenkebach.  Overall doing well.  Risk Assessment/Calculations:            Physical Exam:   VS:  BP 112/68 (BP Location: Left Arm, Patient Position: Sitting, Cuff Size: Large)   Pulse 81   Ht 5\' 6"  (1.676 m)   Wt 144 lb 1.6 oz (65.4 kg)   BMI 23.26 kg/m    Wt Readings from Last 3 Encounters:  04/28/23 144 lb 1.6 oz (65.4 kg)  05/13/22 133 lb (60.3 kg)  05/08/22 133 lb 6.4 oz  (60.5 kg)    GEN: Well nourished, well developed in no acute distress NECK: No JVD; No carotid bruits CARDIAC: RRR, no murmurs, rubs, gallops RESPIRATORY:  Clear to auscultation without rales, wheezing or rhonchi  ABDOMEN: Soft, non-tender, non-distended EXTREMITIES:  No edema; No deformity   ASSESSMENT AND PLAN: .    Coronary disease - Minimal LAD plaque on prior CT scan 2021.  Continuing with atorvastatin 20 mg with LDL of 58 in 2023.  Excellent.  Plaque stabilization.  Thankfully there was no flow-limiting disease. - Occasionally will have some chest discomfort but there was no evidence of any significant coronary plaque present.  Could be musculoskeletal or GI related potentially.  Hyperlipidemia - Atorvastatin 20 mg a day.  LDL 58.  Former smoker, alcohol - Quit early 2023.  Excellent.  Doing very well.        Dispo: 1 year  Signed, Donato Schultz, MD

## 2023-05-12 ENCOUNTER — Other Ambulatory Visit: Payer: Self-pay | Admitting: Cardiology

## 2023-11-30 ENCOUNTER — Telehealth: Payer: Self-pay | Admitting: Cardiology

## 2023-11-30 NOTE — Telephone Encounter (Signed)
 Spoke with patient and she states she has been having chest tightness off and on. Over the weekend she has been having chest tightness with sharp pains. She also has some left arm pain and heaviness today.   Advised patient being that she is having chest tightness and left arm pain she needs to call 911 and go to the ED.

## 2023-11-30 NOTE — Telephone Encounter (Signed)
 Pt c/o of Chest Pain: STAT if active CP, including tightness, pressure, jaw pain, radiating pain to shoulder/upper arm/back, CP unrelieved by Nitro. Symptoms reported of SOB, nausea, vomiting, sweating.  1. Are you having CP right now? No   2. Are you experiencing any other symptoms (ex. SOB, nausea, vomiting, sweating)? Weakness in left arm especially at night when she lays down, feels weak and out of breath   3. Is your CP continuous or coming and going? Coming and going   4. Have you taken Nitroglycerin ? No   5. How long have you been experiencing CP? For a week    6. If NO CP at time of call then end call with telling Pt to call back or call 911 if Chest pain returns prior to return call from triage team.    Patient's husband is concerned she needs to go the ER and the patient mentioned she was not interested in going. Please advise.

## 2023-12-01 NOTE — Telephone Encounter (Signed)
Spoke with pt who is aware we are unable to see [facility] results.  She will call them and have results sent to Dr Anne Fu.reports ED MD stated everything looked normal.

## 2023-12-01 NOTE — Telephone Encounter (Signed)
Called the patient to offer an appt with NP Robin Searing for this afternoon at 3:30 PM. Patient stated she was seen at [facility] and would like Dr. Anne Fu to review the test that were completed before scheduling an appointment. Please advise.

## 2023-12-07 NOTE — Telephone Encounter (Signed)
 Records received from [facility] and sent to Dr Anne Fu to review as requested by pt.  (In [facility] charts)

## 2023-12-11 NOTE — Telephone Encounter (Signed)
 Dr Anne Fu has reviewed results from ED records from [facility].  No concerning abnormal results noted.

## 2023-12-11 NOTE — Telephone Encounter (Signed)
 Spoke with pt who is aware Dr Anne Fu has reviewed results and found nothing concerning.  She was appreciative of the follow up and information.  She will call back if any further issues.

## 2024-05-23 ENCOUNTER — Other Ambulatory Visit: Payer: Self-pay | Admitting: Cardiology

## 2024-08-24 ENCOUNTER — Encounter (INDEPENDENT_AMBULATORY_CARE_PROVIDER_SITE_OTHER): Payer: Self-pay | Admitting: Gastroenterology

## 2024-08-29 ENCOUNTER — Other Ambulatory Visit: Payer: Self-pay | Admitting: Cardiology

## 2024-08-31 ENCOUNTER — Other Ambulatory Visit: Payer: Self-pay | Admitting: Cardiology

## 2024-10-26 ENCOUNTER — Encounter: Payer: Self-pay | Admitting: Physical Therapy

## 2024-10-26 ENCOUNTER — Other Ambulatory Visit: Payer: Self-pay

## 2024-10-26 ENCOUNTER — Ambulatory Visit: Payer: Self-pay | Attending: Neurosurgery | Admitting: Physical Therapy

## 2024-10-26 DIAGNOSIS — M533 Sacrococcygeal disorders, not elsewhere classified: Secondary | ICD-10-CM | POA: Insufficient documentation

## 2024-10-26 DIAGNOSIS — M62830 Muscle spasm of back: Secondary | ICD-10-CM | POA: Insufficient documentation

## 2024-10-26 DIAGNOSIS — M5459 Other low back pain: Secondary | ICD-10-CM | POA: Insufficient documentation

## 2024-10-26 DIAGNOSIS — M6281 Muscle weakness (generalized): Secondary | ICD-10-CM | POA: Diagnosis present

## 2024-10-26 NOTE — Therapy (Signed)
 " OUTPATIENT PHYSICAL THERAPY THORACOLUMBAR EVALUATION   Patient Name: Kendra Moreno MRN: [id number] DOB:July 07, 1976, 49 y.o., female Today's Date: 10/27/2024  END OF SESSION:  PT End of Session - 10/26/24 1601     Visit Number 1    Number of Visits 16    Date for Recertification  12/22/24    Authorization Time Period 10 visit authorized 10/26/2024-12/25/2024    Authorization - Number of Visits 10    PT Start Time 1601    PT Stop Time 1705    PT Time Calculation (min) 64 min    Activity Tolerance Patient tolerated treatment well          Past Medical History:  Diagnosis Date   Anxiety    CAD (coronary artery disease)    Cigarette smoker    COPD (chronic obstructive pulmonary disease) (HCC)    EKG abnormalities    Heart block    Hypercholesteremia    MRSA (methicillin resistant Staphylococcus aureus)    Patient states she has had MRSA twice years ago.   Seizures [facility])    Past Surgical History:  Procedure Laterality Date   ACNE CYST REMOVAL     BIOPSY  05/14/2022   Procedure: BIOPSY;  Surgeon: Eartha Angelia Sieving, MD;  [facility];  Service: Gastroenterology;;   CARPAL TUNNEL RELEASE Left 09/16/2021   Procedure: LEFT CARPAL TUNNEL RELEASE;  Surgeon: Murrell Drivers, MD;  Location: [facility];  Service: Orthopedics;  Laterality: Left;  45 MIN   CARPAL TUNNEL RELEASE Right 10/17/2021   Procedure: RIGHT CARPAL TUNNEL RELEASE;  Surgeon: Murrell Drivers, MD;  Location: [facility];  Service: Orthopedics;  Laterality: Right;   CESAREAN SECTION     ESOPHAGOGASTRODUODENOSCOPY (EGD) WITH PROPOFOL  N/A 05/14/2022   Procedure: ESOPHAGOGASTRODUODENOSCOPY (EGD) WITH PROPOFOL ;  Surgeon: Eartha Angelia Sieving, MD;  [facility];  Service: Gastroenterology;  Laterality: N/A;  1045 ASA 1   TRIGGER FINGER RELEASE Left 09/16/2021   Procedure: RELEASE TRIGGER FINGER/A-1 PULLEY LEFT INDEX FINGER;  Surgeon: Murrell Drivers, MD;  Location: [facility];  Service: Orthopedics;  Laterality: Left;   TRIGGER FINGER RELEASE Right 10/17/2021   Procedure: RELEASE TRIGGER FINGER/A-1 PULLEY RIGHT THUMB, RIGHT MIDDLE FINGER;  Surgeon: Murrell Drivers, MD;  Location: [facility];  Service: Orthopedics;  Laterality: Right;   Patient Active Problem List   Diagnosis Date Noted   Abdominal pain 05/08/2022   Bloating 05/08/2022   Elevated LFTs 05/08/2022   Coronary artery disease involving native coronary artery of native heart without angina pectoris 03/18/2022   Former smoker 03/18/2022   Pure hypercholesterolemia 03/18/2022   Hyperlipidemia 10/01/2021   Bilateral carpal tunnel syndrome 08/14/2021   Paresthesia 06/04/2021    PCP: Lonna Maxwell, PA-C  REFERRING PROVIDER: Lanis Pupa, MD  REFERRING DIAG: [phone] (ICD-10-CM) - Spondylosis without myelopathy or radiculopathy, lumbosacral region   Rationale for Evaluation and Treatment: Rehabilitation  THERAPY DIAG:  Other low back pain  Sacrococcygeal disorders, not elsewhere classified  Muscle weakness (generalized)  Muscle spasm of back  ONSET DATE: Chronic back pain  SUBJECTIVE:  SUBJECTIVE STATEMENT: Pt states her last MRI was performed >10 years ago. Got nerve ablasian back then. Didn't last long but has been dealing with chronic back pain. Has been using heating pad constantly to relieve it in the mornings. Thinks she may have been doing it too much because it has been discoloring her skin. Went skating ~2 years ago and fell backwards and her collarbone popped. States she hit her tailbone and has remained sore. Most aggravated with sitting. Pt works out every morning in the gym. Unable to perform squat but is a to do some weights at the gym.   PERTINENT HISTORY:   Bilat carpal tunnel surgery  PAIN:  Are you having pain? Yes: NPRS scale: at worst 9, at best can be 0 Pain location: tail bone Pain description: like I just got kicked Aggravating factors: sitting, tender to the touch, mornings Relieving factors: heat  At worst 10, at best 5 Pain location: lower middle lumbar  Pain description: ache Aggravating factors: getting up Relieving factors: heat   PRECAUTIONS: None  RED FLAGS: None   WEIGHT BEARING RESTRICTIONS: No  FALLS:  Has patient fallen in last 6 months? No  LIVING ENVIRONMENT: Lives with: lives with their family Lives in: House/apartment Stairs: No Has following equipment at home: None  OCCUPATION: Hair dresser x 30 years; just whenever someone calls  PLOF: Independent  PATIENT GOALS: Improve pain, strength and mobility  NEXT MD VISIT: PRN (6 visits before MRI)  OBJECTIVE:  Note: Objective measures were completed at Evaluation unless otherwise noted.  DIAGNOSTIC FINDINGS:  Nothing recent  PATIENT SURVEYS:  PSFS: THE PATIENT SPECIFIC FUNCTIONAL SCALE  Place score of 0-10 (0 = unable to perform activity and 10 = able to perform activity at the same level as before injury or problem)  Activity Date: 10/26/24    Morning OOB mobility TBA    2. Squatting exercise/lifting 0    3.      4.      Total Score       Total Score = Sum of activity scores/number of activities  Minimally Detectable Change: 3 points (for single activity); 2 points (for average score)  [facility] (nd). The Patient Specific Functional Scale . Retrieved from [email or web address]   COGNITION: Overall cognitive status: Within functional limits for tasks assessed     SENSATION: WFL  MUSCLE LENGTH: Hamstrings: Right >70 deg; Left >70 deg Thomas test: WNL Supine Leg length: L 88 cm, R 87 cm  POSTURE: slightly higher L iliac crest in standing In sitting can see that  L femur protrudes more anteriorly than R  PALPATION: TTP lumbar/sacral junction bilaterally and coccyx with palpation  LUMBAR ROM:   AROM eval  Flexion 80%   Extension 50% p!  Right lateral flexion 1 above knee; p!  Left lateral flexion To knee  Right rotation 50%  Left rotation 50%   (Blank rows = not tested)  LOWER EXTREMITY ROM:   L hip internally rotates during hip flexion in supine but PROM is grossly WNL and so is AROM  Active  Right eval Left eval  Hip flexion    Hip extension    Hip abduction    Hip adduction    Hip internal rotation    Hip external rotation    Knee flexion    Knee extension    Ankle dorsiflexion    Ankle plantarflexion    Ankle inversion    Ankle eversion     (Blank rows =  not tested)  LOWER EXTREMITY MMT:    MMT Right eval Left eval  Hip flexion 4+ 3+  Hip extension 4 3+  Hip abduction 4+ 3+  Hip adduction    Hip internal rotation    Hip external rotation    Knee flexion 5 4  Knee extension 5 5  Ankle dorsiflexion    Ankle plantarflexion    Ankle inversion    Ankle eversion     (Blank rows = not tested)  LUMBAR SPECIAL TESTS:  Straight leg raise test: Negative, Single leg stance test: Positive, FABER test: Negative, and Thomas test: Negative  FUNCTIONAL TESTS:  Double leg lowering test: unable to maintain low back flat until 80 deg SLS: R LE >1 min, L LE 42.52 sec (reports L foot tingling)  GAIT: Distance walked: Into clinic Assistive device utilized: None Level of assistance: Complete Independence Comments: Normal reciprocal pattern  TREATMENT DATE: 10/26/24 See HEP below Self care: TENS use    PATIENT EDUCATION:  Education details: Exam findings, POC, initial HEP, TENS use Person educated: Patient Education method: Explanation, Demonstration, and Handouts Education comprehension: verbalized understanding, returned demonstration, and needs further education  HOME EXERCISE PROGRAM: Access Code: [email or web address] URL:  [email or web address] Date: 10/26/2024 Prepared by: Zenith Kercheval April Earnie Starring  Exercises - Supine Posterior Pelvic Tilt with Pelvic Floor Contraction  - 1 x daily - 7 x weekly - 3 sets - 10 reps - 3-5 sec hold - Supine March with Posterior Pelvic Tilt  - 1 x daily - 7 x weekly - 3 sets - 10 reps - 3-5 sec hold - Supine Bridge with Resistance Band  - 1 x daily - 7 x weekly - 3 sets - 10 reps - 3-5 sec hold   ASSESSMENT:  CLINICAL IMPRESSION: Patient is a 49 y.o. F who was seen today for physical therapy evaluation and treatment for bilat lumbosacral and coccygeal pain. PMH is significant for chronic low back pain. Only able to reproduce coccyx pain with palpation/pressure. Assessment demos L>R LE weakness, core weakness and decreased lumbar ROM affecting functional mobility, t/fs, and amb. Pt will benefit from PT to address these deficits and improve overall mobility.   OBJECTIVE IMPAIRMENTS: Abnormal gait, decreased activity tolerance, decreased balance, decreased coordination, decreased endurance, decreased mobility, decreased ROM, decreased strength, hypomobility, increased fascial restrictions, increased muscle spasms, impaired flexibility, improper body mechanics, postural dysfunction, and pain.   ACTIVITY LIMITATIONS: lifting, bending, standing, squatting, transfers, and locomotion level  PARTICIPATION LIMITATIONS: meal prep, cleaning, laundry, shopping, community activity, and recreational activity  PERSONAL FACTORS: Age, Fitness, Past/current experiences, and Time since onset of injury/illness/exacerbation are also affecting patient's functional outcome.   REHAB POTENTIAL: Good  CLINICAL DECISION MAKING: Evolving/moderate complexity  EVALUATION COMPLEXITY: Moderate   GOALS: Goals reviewed with patient? Yes  SHORT TERM GOALS: Target date: 11/24/2024   Pt will be ind with initial HEP Baseline: Goal status: INITIAL  2.  Pt will demo L = R SLS without N/T to  demo increased L LE strength and stability Baseline:  Goal status: INITIAL  3.  Pt will report reduced overall pain (frequency, intensity, duration) by >/=25% Baseline:  Goal status: INITIAL   LONG TERM GOALS: Target date: 12/22/2024   Pt will be ind with management and progression of HEP Baseline:  Goal status: INITIAL  2.  Pt will demo improved core strength by tolerating double leg lowering test to at least 45 deg Baseline:  Goal status: INITIAL  3.  Pt will be able to report  improved PSFS average by at least 2 points Baseline:  Goal status: INITIAL  4.  Pt will be able to squat and lift/carry at least 20 lbs for gym activities without back pain Baseline:  Goal status: INITIAL  5.  Pt will report improved overall pain (frequency, duration, intensity) by >/=50% Baseline:  Goal status: INITIAL   PLAN:  PT FREQUENCY: 1-2x/week  PT DURATION: 8 weeks  PLANNED INTERVENTIONS: 97164- PT Re-evaluation, 97750- Physical Performance Testing, 97110-Therapeutic exercises, 97530- Therapeutic activity, [id number]- Neuromuscular re-education, 97535- Self Care, [id number]- Manual therapy, [id number]- Gait training, [phone]- Electrical stimulation (unattended), 20560 (1-2 muscles), 20561 (3+ muscles)- Dry Needling, Patient/Family education, Balance training, Stair training, Taping, Joint mobilization, Spinal mobilization, Cryotherapy, and Moist heat.  PLAN FOR NEXT SESSION: Have patient rate PSFS. L LE strengthening. Core strengthening.    Lauri Purdum April Ma L Milly Goggins, PT, DPT 10/27/2024, 8:19 AM  "

## 2024-11-10 ENCOUNTER — Ambulatory Visit

## 2024-11-14 ENCOUNTER — Ambulatory Visit

## 2024-11-17 ENCOUNTER — Ambulatory Visit

## 2024-11-17 DIAGNOSIS — M533 Sacrococcygeal disorders, not elsewhere classified: Secondary | ICD-10-CM

## 2024-11-17 DIAGNOSIS — M62830 Muscle spasm of back: Secondary | ICD-10-CM

## 2024-11-17 DIAGNOSIS — M5459 Other low back pain: Secondary | ICD-10-CM

## 2024-11-17 DIAGNOSIS — M6281 Muscle weakness (generalized): Secondary | ICD-10-CM

## 2024-11-17 NOTE — Therapy (Signed)
 " OUTPATIENT PHYSICAL THERAPY TREATMENT  Patient Name: CARLIA BOMKAMP MRN: [id number] DOB:05-25-1976, 49 y.o., female Today's Date: 11/17/2024  END OF SESSION:  PT End of Session - 11/17/24 1604     Visit Number 2    Number of Visits 16    Date for Recertification  12/22/24    Authorization Time Period 10 visit authorized 10/26/2024-12/25/2024    Authorization - Visit Number 2    Authorization - Number of Visits 10    PT Start Time 1603    PT Stop Time 1647    PT Time Calculation (min) 44 min    Activity Tolerance Patient tolerated treatment well    Behavior During Therapy WFL for tasks assessed/performed          Past Medical History:  Diagnosis Date   Anxiety    CAD (coronary artery disease)    Cigarette smoker    COPD (chronic obstructive pulmonary disease) (HCC)    EKG abnormalities    Heart block    Hypercholesteremia    MRSA (methicillin resistant Staphylococcus aureus)    Patient states she has had MRSA twice years ago.   Seizures [facility])    Past Surgical History:  Procedure Laterality Date   ACNE CYST REMOVAL     BIOPSY  05/14/2022   Procedure: BIOPSY;  Surgeon: Eartha Angelia Sieving, MD;  [facility];  Service: Gastroenterology;;   CARPAL TUNNEL RELEASE Left 09/16/2021   Procedure: LEFT CARPAL TUNNEL RELEASE;  Surgeon: Murrell Drivers, MD;  Location: [facility];  Service: Orthopedics;  Laterality: Left;  45 MIN   CARPAL TUNNEL RELEASE Right 10/17/2021   Procedure: RIGHT CARPAL TUNNEL RELEASE;  Surgeon: Murrell Drivers, MD;  Location: [facility];  Service: Orthopedics;  Laterality: Right;   CESAREAN SECTION     ESOPHAGOGASTRODUODENOSCOPY (EGD) WITH PROPOFOL  N/A 05/14/2022   Procedure: ESOPHAGOGASTRODUODENOSCOPY (EGD) WITH PROPOFOL ;  Surgeon: Eartha Angelia Sieving, MD;  [facility];  Service: Gastroenterology;  Laterality: N/A;  1045 ASA 1   TRIGGER FINGER RELEASE Left 09/16/2021   Procedure: RELEASE TRIGGER  FINGER/A-1 PULLEY LEFT INDEX FINGER;  Surgeon: Murrell Drivers, MD;  Location: [facility];  Service: Orthopedics;  Laterality: Left;   TRIGGER FINGER RELEASE Right 10/17/2021   Procedure: RELEASE TRIGGER FINGER/A-1 PULLEY RIGHT THUMB, RIGHT MIDDLE FINGER;  Surgeon: Murrell Drivers, MD;  Location: [facility];  Service: Orthopedics;  Laterality: Right;   Patient Active Problem List   Diagnosis Date Noted   Abdominal pain 05/08/2022   Bloating 05/08/2022   Elevated LFTs 05/08/2022   Coronary artery disease involving native coronary artery of native heart without angina pectoris 03/18/2022   Former smoker 03/18/2022   Pure hypercholesterolemia 03/18/2022   Hyperlipidemia 10/01/2021   Bilateral carpal tunnel syndrome 08/14/2021   Paresthesia 06/04/2021    PCP: Lonna Maxwell, PA-C  REFERRING PROVIDER: Lonna Maxwell, PA-C  REFERRING DIAG: [phone] (ICD-10-CM) - Spondylosis without myelopathy or radiculopathy, lumbosacral region   Rationale for Evaluation and Treatment: Rehabilitation  THERAPY DIAG:  Other low back pain  Sacrococcygeal disorders, not elsewhere classified  Muscle weakness (generalized)  Muscle spasm of back  ONSET DATE: Chronic back pain  SUBJECTIVE:  SUBJECTIVE STATEMENT:  Pt reports continued LBP L>R. She rates her pain as 3-4/10 at rest and sometimes 8-9/10 mostly with transitions. She reports continued tenderness to lumbar musculature on L side.    EVAL Pt states her last MRI was performed >10 years ago. Got nerve ablasian back then. Didn't last long but has been dealing with chronic back pain. Has been using heating pad constantly to relieve it in the mornings. Thinks she may have been doing it too much because it has been discoloring her skin. Went skating  ~2 years ago and fell backwards and her collarbone popped. States she hit her tailbone and has remained sore. Most aggravated with sitting. Pt works out every morning in the gym. Unable to perform squat but is a to do some weights at the gym.   PERTINENT HISTORY:  Bilat carpal tunnel surgery  PAIN:  Are you having pain? Yes: NPRS scale: at worst 9, at best can be 0 Pain location: tail bone Pain description: like I just got kicked Aggravating factors: sitting, tender to the touch, mornings Relieving factors: heat  At worst 10, at best 5 Pain location: lower middle lumbar  Pain description: ache Aggravating factors: getting up Relieving factors: heat   PRECAUTIONS: None  RED FLAGS: None   WEIGHT BEARING RESTRICTIONS: No  FALLS:  Has patient fallen in last 6 months? No  LIVING ENVIRONMENT: Lives with: lives with their family Lives in: House/apartment Stairs: No Has following equipment at home: None  OCCUPATION: Hair dresser x 30 years; just whenever someone calls  PLOF: Independent  PATIENT GOALS: Improve pain, strength and mobility  NEXT MD VISIT: PRN (6 visits before MRI)  OBJECTIVE:  Note: Objective measures were completed at Evaluation unless otherwise noted.  DIAGNOSTIC FINDINGS:  Nothing recent  PATIENT SURVEYS:  PSFS: THE PATIENT SPECIFIC FUNCTIONAL SCALE  Place score of 0-10 (0 = unable to perform activity and 10 = able to perform activity at the same level as before injury or problem)  Activity Date: 10/26/24    Morning OOB mobility TBA    2. Squatting exercise/lifting 0    3.      4.      Total Score       Total Score = Sum of activity scores/number of activities  Minimally Detectable Change: 3 points (for single activity); 2 points (for average score)  [facility] (nd). The Patient Specific Functional Scale . Retrieved from [email or web address]   COGNITION: Overall cognitive  status: Within functional limits for tasks assessed     SENSATION: WFL  MUSCLE LENGTH: Hamstrings: Right >70 deg; Left >70 deg Thomas test: WNL Supine Leg length: L 88 cm, R 87 cm  POSTURE: slightly higher L iliac crest in standing In sitting can see that L femur protrudes more anteriorly than R  PALPATION: TTP lumbar/sacral junction bilaterally and coccyx with palpation  LUMBAR ROM:   AROM eval  Flexion 80%   Extension 50% p!  Right lateral flexion 1 above knee; p!  Left lateral flexion To knee  Right rotation 50%  Left rotation 50%   (Blank rows = not tested)  LOWER EXTREMITY ROM:   L hip internally rotates during hip flexion in supine but PROM is grossly WNL and so is AROM  Active  Right eval Left eval  Hip flexion    Hip extension    Hip abduction    Hip adduction    Hip internal rotation    Hip external rotation  Knee flexion    Knee extension    Ankle dorsiflexion    Ankle plantarflexion    Ankle inversion    Ankle eversion     (Blank rows = not tested)  LOWER EXTREMITY MMT:    MMT Right eval Left eval  Hip flexion 4+ 3+  Hip extension 4 3+  Hip abduction 4+ 3+  Hip adduction    Hip internal rotation    Hip external rotation    Knee flexion 5 4  Knee extension 5 5  Ankle dorsiflexion    Ankle plantarflexion    Ankle inversion    Ankle eversion     (Blank rows = not tested)  LUMBAR SPECIAL TESTS:  Straight leg raise test: Negative, Single leg stance test: Positive, FABER test: Negative, and Thomas test: Negative  FUNCTIONAL TESTS:  Double leg lowering test: unable to maintain low back flat until 80 deg SLS: R LE >1 min, L LE 42.52 sec (reports L foot tingling)  GAIT: Distance walked: Into clinic Assistive device utilized: None Level of assistance: Complete Independence Comments: Normal reciprocal pattern  TREATMENT DATE:   11/17/24  Bird Dog 1 x 10 reps hold 3 sec each PPT 1 x 12 reps hold 3 sec Dead Bug 2 x 10 reps hold 3  sec PPT w/ march 2 x 10 reps   Trigger Point Dry Needling Initial Treatment: Pt instructed on Dry Needling rational, procedures, and possible side effects. Pt instructed to expect mild to moderate muscle soreness later in the day and/or into the next day.  Pt instructed in methods to reduce muscle soreness. Pt instructed to continue prescribed HEP. Because Dry Needling was performed over or adjacent to a lung field, pt was educated on S/S of pneumothorax and to seek immediate medical attention should they occur.  Patient was educated on signs and symptoms of infection and other risk factors and advised to seek medical attention should they occur.  Patient verbalized understanding of these instructions and education.  Patient Verbal Consent Given: Yes Education Handout Provided: Yes Muscles Treated: bilateral Lumbar paraspinals and multifidi L3-5 Electrical Stimulation Performed: No Treatment Response/Outcome:  good overall tolerance,twitch response noted bilaterally. Decrease in concordant pain immediately after.      10/26/24 See HEP below Self care: TENS use    PATIENT EDUCATION:  Education details: Exam findings, POC, initial HEP, TENS use Person educated: Patient Education method: Explanation, Demonstration, and Handouts Education comprehension: verbalized understanding, returned demonstration, and needs further education  HOME EXERCISE PROGRAM: Access Code: [email or web address] URL: [email or web address] Date: 11/17/2024 Prepared by: Massie Ada  Exercises - Supine Posterior Pelvic Tilt with Pelvic Floor Contraction  - 1 x daily - 7 x weekly - 3 sets - 10 reps - 3-5 sec hold - Supine March with Posterior Pelvic Tilt  - 1 x daily - 7 x weekly - 3 sets - 10 reps - 3-5 sec hold - Supine Bridge with Resistance Band  - 1 x daily - 7 x weekly - 3 sets - 10 reps - 3-5 sec hold - Supine Dead Bug with Leg Extension  - 1 x daily - 7 x weekly - 2 sets - 10 reps - 3 hold - Bird Dog   - 1 x daily - 7 x weekly - 2 sets - 10 reps - 3 hold   ASSESSMENT:  CLINICAL IMPRESSION: Pt tolerated session well reporting decrease in concordant pain and muscle tension following DN and exercise. Emphasis today was on core stabilization and updated HEP.  EVAL Patient is a 49 y.o. F who was seen today for physical therapy evaluation and treatment for bilat lumbosacral and coccygeal pain. PMH is significant for chronic low back pain. Only able to reproduce coccyx pain with palpation/pressure. Assessment demos L>R LE weakness, core weakness and decreased lumbar ROM affecting functional mobility, t/fs, and amb. Pt will benefit from PT to address these deficits and improve overall mobility.   OBJECTIVE IMPAIRMENTS: Abnormal gait, decreased activity tolerance, decreased balance, decreased coordination, decreased endurance, decreased mobility, decreased ROM, decreased strength, hypomobility, increased fascial restrictions, increased muscle spasms, impaired flexibility, improper body mechanics, postural dysfunction, and pain.   ACTIVITY LIMITATIONS: lifting, bending, standing, squatting, transfers, and locomotion level  PARTICIPATION LIMITATIONS: meal prep, cleaning, laundry, shopping, community activity, and recreational activity  PERSONAL FACTORS: Age, Fitness, Past/current experiences, and Time since onset of injury/illness/exacerbation are also affecting patient's functional outcome.   REHAB POTENTIAL: Good  CLINICAL DECISION MAKING: Evolving/moderate complexity  EVALUATION COMPLEXITY: Moderate   GOALS: Goals reviewed with patient? Yes  SHORT TERM GOALS: Target date: 11/24/2024   Pt will be ind with initial HEP Baseline: Goal status: INITIAL  2.  Pt will demo L = R SLS without N/T to demo increased L LE strength and stability Baseline:  Goal status: INITIAL  3.  Pt will report reduced overall pain (frequency, intensity, duration) by >/=25% Baseline:  Goal status:  INITIAL   LONG TERM GOALS: Target date: 12/22/2024   Pt will be ind with management and progression of HEP Baseline:  Goal status: INITIAL  2.  Pt will demo improved core strength by tolerating double leg lowering test to at least 45 deg Baseline:  Goal status: INITIAL  3.  Pt will be able to report improved PSFS average by at least 2 points Baseline:  Goal status: INITIAL  4.  Pt will be able to squat and lift/carry at least 20 lbs for gym activities without back pain Baseline:  Goal status: INITIAL  5.  Pt will report improved overall pain (frequency, duration, intensity) by >/=50% Baseline:  Goal status: INITIAL   PLAN:  PT FREQUENCY: 1-2x/week  PT DURATION: 8 weeks  PLANNED INTERVENTIONS: 97164- PT Re-evaluation, 97750- Physical Performance Testing, 97110-Therapeutic exercises, 97530- Therapeutic activity, [id number]- Neuromuscular re-education, 97535- Self Care, 02859- Manual therapy, [id number]- Gait training, [phone]- Electrical stimulation (unattended), 20560 (1-2 muscles), 20561 (3+ muscles)- Dry Needling, Patient/Family education, Balance training, Stair training, Taping, Joint mobilization, Spinal mobilization, Cryotherapy, and Moist heat.  PLAN FOR NEXT SESSION: *Have patient rate PSFS. Continue L LE strengthening. Core strengthening.    Massie Ada, PT, DPT 11/17/2024, 4:51 PM  "

## 2024-11-17 NOTE — Patient Instructions (Signed)

## 2024-11-21 ENCOUNTER — Ambulatory Visit: Admitting: Physical Therapy

## 2024-11-23 ENCOUNTER — Ambulatory Visit: Admitting: Physical Therapy

## 2024-11-23 ENCOUNTER — Encounter: Payer: Self-pay | Admitting: Physical Therapy

## 2024-11-23 DIAGNOSIS — M5459 Other low back pain: Secondary | ICD-10-CM

## 2024-11-23 DIAGNOSIS — M533 Sacrococcygeal disorders, not elsewhere classified: Secondary | ICD-10-CM

## 2024-11-23 DIAGNOSIS — M6281 Muscle weakness (generalized): Secondary | ICD-10-CM

## 2024-11-23 NOTE — Therapy (Signed)
 " OUTPATIENT PHYSICAL THERAPY TREATMENT  Patient Name: Kendra Moreno MRN: [id number] DOB:February 08, 1976, 49 y.o., female Today's Date: 11/23/2024  END OF SESSION:  PT End of Session - 11/23/24 1637     Visit Number 3    Number of Visits 16    Date for Recertification  12/22/24    Authorization Time Period 10 visit authorized 10/26/2024-12/25/2024    Authorization - Visit Number 3    Authorization - Number of Visits 10    PT Start Time 1600    PT Stop Time 1640    PT Time Calculation (min) 40 min    Activity Tolerance Patient tolerated treatment well    Behavior During Therapy WFL for tasks assessed/performed           Past Medical History:  Diagnosis Date   Anxiety    CAD (coronary artery disease)    Cigarette smoker    COPD (chronic obstructive pulmonary disease) (HCC)    EKG abnormalities    Heart block    Hypercholesteremia    MRSA (methicillin resistant Staphylococcus aureus)    Patient states she has had MRSA twice years ago.   Seizures [facility])    Past Surgical History:  Procedure Laterality Date   ACNE CYST REMOVAL     BIOPSY  05/14/2022   Procedure: BIOPSY;  Surgeon: Eartha Angelia Sieving, MD;  [facility];  Service: Gastroenterology;;   CARPAL TUNNEL RELEASE Left 09/16/2021   Procedure: LEFT CARPAL TUNNEL RELEASE;  Surgeon: Murrell Drivers, MD;  Location: [facility];  Service: Orthopedics;  Laterality: Left;  45 MIN   CARPAL TUNNEL RELEASE Right 10/17/2021   Procedure: RIGHT CARPAL TUNNEL RELEASE;  Surgeon: Murrell Drivers, MD;  Location: [facility];  Service: Orthopedics;  Laterality: Right;   CESAREAN SECTION     ESOPHAGOGASTRODUODENOSCOPY (EGD) WITH PROPOFOL  N/A 05/14/2022   Procedure: ESOPHAGOGASTRODUODENOSCOPY (EGD) WITH PROPOFOL ;  Surgeon: Eartha Angelia Sieving, MD;  Location: [facility];  Service: Gastroenterology;  Laterality: N/A;  1045 ASA 1   TRIGGER FINGER RELEASE Left 09/16/2021   Procedure: RELEASE TRIGGER  FINGER/A-1 PULLEY LEFT INDEX FINGER;  Surgeon: Murrell Drivers, MD;  Location: [facility];  Service: Orthopedics;  Laterality: Left;   TRIGGER FINGER RELEASE Right 10/17/2021   Procedure: RELEASE TRIGGER FINGER/A-1 PULLEY RIGHT THUMB, RIGHT MIDDLE FINGER;  Surgeon: Murrell Drivers, MD;  Location: [facility];  Service: Orthopedics;  Laterality: Right;   Patient Active Problem List   Diagnosis Date Noted   Abdominal pain 05/08/2022   Bloating 05/08/2022   Elevated LFTs 05/08/2022   Coronary artery disease involving native coronary artery of native heart without angina pectoris 03/18/2022   Former smoker 03/18/2022   Pure hypercholesterolemia 03/18/2022   Hyperlipidemia 10/01/2021   Bilateral carpal tunnel syndrome 08/14/2021   Paresthesia 06/04/2021    PCP: Lonna Maxwell, PA-C  REFERRING PROVIDER: Lonna Maxwell, PA-C  REFERRING DIAG: [phone] (ICD-10-CM) - Spondylosis without myelopathy or radiculopathy, lumbosacral region   Rationale for Evaluation and Treatment: Rehabilitation  THERAPY DIAG:  Other low back pain  Sacrococcygeal disorders, not elsewhere classified  Muscle weakness (generalized)  ONSET DATE: Chronic back pain  SUBJECTIVE:  SUBJECTIVE STATEMENT:  She states the DN really helped, she would like to have this again.    EVAL Pt states her last MRI was performed >10 years ago. Got nerve ablasian back then. Didn't last long but has been dealing with chronic back pain. Has been using heating pad constantly to relieve it in the mornings. Thinks she may have been doing it too much because it has been discoloring her skin. Went skating ~2 years ago and fell backwards and her collarbone popped. States she hit her tailbone and has remained sore. Most aggravated with  sitting. Pt works out every morning in the gym. Unable to perform squat but is a to do some weights at the gym.   PERTINENT HISTORY:  Bilat carpal tunnel surgery  PAIN:  Are you having pain? Yes: NPRS scale: at worst 9, at best can be 0 Pain location: tail bone Pain description: like I just got kicked Aggravating factors: sitting, tender to the touch, mornings Relieving factors: heat  At worst 10, at best 5 Pain location: lower middle lumbar  Pain description: ache Aggravating factors: getting up Relieving factors: heat   PRECAUTIONS: None  RED FLAGS: None   WEIGHT BEARING RESTRICTIONS: No  FALLS:  Has patient fallen in last 6 months? No  LIVING ENVIRONMENT: Lives with: lives with their family Lives in: House/apartment Stairs: No Has following equipment at home: None  OCCUPATION: Hair dresser x 30 years; just whenever someone calls  PLOF: Independent  PATIENT GOALS: Improve pain, strength and mobility  NEXT MD VISIT: PRN (6 visits before MRI)  OBJECTIVE:  Note: Objective measures were completed at Evaluation unless otherwise noted.  DIAGNOSTIC FINDINGS:  Nothing recent  PATIENT SURVEYS:  PSFS: THE PATIENT SPECIFIC FUNCTIONAL SCALE  Place score of 0-10 (0 = unable to perform activity and 10 = able to perform activity at the same level as before injury or problem)  Activity Date: 10/26/24    Morning OOB mobility TBA    2. Squatting exercise/lifting 0    3.      4.      Total Score       Total Score = Sum of activity scores/number of activities  Minimally Detectable Change: 3 points (for single activity); 2 points (for average score)  [facility] (nd). The Patient Specific Functional Scale . Retrieved from [email or web address]   COGNITION: Overall cognitive status: Within functional limits for tasks assessed     SENSATION: WFL  MUSCLE LENGTH: Hamstrings: Right >70 deg; Left >70  deg Thomas test: WNL Supine Leg length: L 88 cm, R 87 cm  POSTURE: slightly higher L iliac crest in standing In sitting can see that L femur protrudes more anteriorly than R  PALPATION: TTP lumbar/sacral junction bilaterally and coccyx with palpation  LUMBAR ROM:   AROM eval  Flexion 80%   Extension 50% p!  Right lateral flexion 1 above knee; p!  Left lateral flexion To knee  Right rotation 50%  Left rotation 50%   (Blank rows = not tested)  LOWER EXTREMITY ROM:   L hip internally rotates during hip flexion in supine but PROM is grossly WNL and so is AROM  Active  Right eval Left eval  Hip flexion    Hip extension    Hip abduction    Hip adduction    Hip internal rotation    Hip external rotation    Knee flexion    Knee extension    Ankle dorsiflexion  Ankle plantarflexion    Ankle inversion    Ankle eversion     (Blank rows = not tested)  LOWER EXTREMITY MMT:    MMT Right eval Left eval  Hip flexion 4+ 3+  Hip extension 4 3+  Hip abduction 4+ 3+  Hip adduction    Hip internal rotation    Hip external rotation    Knee flexion 5 4  Knee extension 5 5  Ankle dorsiflexion    Ankle plantarflexion    Ankle inversion    Ankle eversion     (Blank rows = not tested)  LUMBAR SPECIAL TESTS:  Straight leg raise test: Negative, Single leg stance test: Positive, FABER test: Negative, and Thomas test: Negative  FUNCTIONAL TESTS:  Double leg lowering test: unable to maintain low back flat until 80 deg SLS: R LE >1 min, L LE 42.52 sec (reports L foot tingling)  GAIT: Distance walked: Into clinic Assistive device utilized: None Level of assistance: Complete Independence Comments: Normal reciprocal pattern  TREATMENT DATE:  11/23/24 Trigger Point Dry Needling Subsequent Treatment: Pt instructed on Dry Needling rational, procedures, and possible side effects. Pt instructed to expect mild to moderate muscle soreness later in the day and/or into the next day.   Pt instructed in methods to reduce muscle soreness. Pt instructed to continue prescribed HEP.  Patient was educated on signs and symptoms of infection and other risk factors and advised to seek medical attention should they occur.  Patient verbalized understanding of these instructions and education.  Muscles Treated: bilateral Lumbar paraspinals and multifidi L3-5 Electrical Stimulation Performed: No Treatment Response/Outcome:  good overall tolerance,twitch response noted bilaterally. Decrease in concordant pain immediately after.  Therex: Supine SKTC stretch 30 sec X 2 bilat Supine LTR 5 sec X 10 Child pose stretch 30 sec X 3 Piriformis stretch 30 sec X 2 bilat  Neuro re-ed: Quadriped Cat-camel 5 sec X 10 Dead bug X 10 reps, hold 3 sec Bridge X 10 reps, hold 3 sec  Manual: Lumbar central PA mobs grade 3-4 to L3-5 STM to lumbar paraspinals  11/17/24  Bird Dog 1 x 10 reps hold 3 sec each PPT 1 x 12 reps hold 3 sec Dead Bug 2 x 10 reps hold 3 sec PPT w/ march 2 x 10 reps   Trigger Point Dry Needling Initial Treatment: Pt instructed on Dry Needling rational, procedures, and possible side effects. Pt instructed to expect mild to moderate muscle soreness later in the day and/or into the next day.  Pt instructed in methods to reduce muscle soreness. Pt instructed to continue prescribed HEP. Because Dry Needling was performed over or adjacent to a lung field, pt was educated on S/S of pneumothorax and to seek immediate medical attention should they occur.  Patient was educated on signs and symptoms of infection and other risk factors and advised to seek medical attention should they occur.  Patient verbalized understanding of these instructions and education.  Patient Verbal Consent Given: Yes Education Handout Provided: Yes Muscles Treated: bilateral Lumbar paraspinals and multifidi L3-5 Electrical Stimulation Performed: No Treatment Response/Outcome:  good overall  tolerance,twitch response noted bilaterally. Decrease in concordant pain immediately after.    PATIENT EDUCATION:  Education details: Exam findings, POC, initial HEP, TENS use Person educated: Patient Education method: Explanation, Demonstration, and Handouts Education comprehension: verbalized understanding, returned demonstration, and needs further education  HOME EXERCISE PROGRAM: Access Code: [email or web address] URL: [email or web address] Date: 11/17/2024 Prepared by: Massie Ada  Exercises - Supine Posterior Pelvic Tilt  with Pelvic Floor Contraction  - 1 x daily - 7 x weekly - 3 sets - 10 reps - 3-5 sec hold - Supine March with Posterior Pelvic Tilt  - 1 x daily - 7 x weekly - 3 sets - 10 reps - 3-5 sec hold - Supine Bridge with Resistance Band  - 1 x daily - 7 x weekly - 3 sets - 10 reps - 3-5 sec hold - Supine Dead Bug with Leg Extension  - 1 x daily - 7 x weekly - 2 sets - 10 reps - 3 hold - Bird Dog  - 1 x daily - 7 x weekly - 2 sets - 10 reps - 3 hold   ASSESSMENT:  CLINICAL IMPRESSION: She had good response to session today with less pain reported. DN was helpful so continued today and will plan to do this as needed to help manage symptoms.    EVAL Patient is a 49 y.o. F who was seen today for physical therapy evaluation and treatment for bilat lumbosacral and coccygeal pain. PMH is significant for chronic low back pain. Only able to reproduce coccyx pain with palpation/pressure. Assessment demos L>R LE weakness, core weakness and decreased lumbar ROM affecting functional mobility, t/fs, and amb. Pt will benefit from PT to address these deficits and improve overall mobility.   OBJECTIVE IMPAIRMENTS: Abnormal gait, decreased activity tolerance, decreased balance, decreased coordination, decreased endurance, decreased mobility, decreased ROM, decreased strength, hypomobility, increased fascial restrictions, increased muscle spasms, impaired flexibility, improper body  mechanics, postural dysfunction, and pain.   ACTIVITY LIMITATIONS: lifting, bending, standing, squatting, transfers, and locomotion level  PARTICIPATION LIMITATIONS: meal prep, cleaning, laundry, shopping, community activity, and recreational activity  PERSONAL FACTORS: Age, Fitness, Past/current experiences, and Time since onset of injury/illness/exacerbation are also affecting patient's functional outcome.   REHAB POTENTIAL: Good  CLINICAL DECISION MAKING: Evolving/moderate complexity  EVALUATION COMPLEXITY: Moderate   GOALS: Goals reviewed with patient? Yes  SHORT TERM GOALS: Target date: 11/24/2024   Pt will be ind with initial HEP Baseline: Goal status: INITIAL  2.  Pt will demo L = R SLS without N/T to demo increased L LE strength and stability Baseline:  Goal status: INITIAL  3.  Pt will report reduced overall pain (frequency, intensity, duration) by >/=25% Baseline:  Goal status: INITIAL   LONG TERM GOALS: Target date: 12/22/2024   Pt will be ind with management and progression of HEP Baseline:  Goal status: INITIAL  2.  Pt will demo improved core strength by tolerating double leg lowering test to at least 45 deg Baseline:  Goal status: INITIAL  3.  Pt will be able to report improved PSFS average by at least 2 points Baseline:  Goal status: INITIAL  4.  Pt will be able to squat and lift/carry at least 20 lbs for gym activities without back pain Baseline:  Goal status: INITIAL  5.  Pt will report improved overall pain (frequency, duration, intensity) by >/=50% Baseline:  Goal status: INITIAL   PLAN:  PT FREQUENCY: 1-2x/week  PT DURATION: 8 weeks  PLANNED INTERVENTIONS: 97164- PT Re-evaluation, 97750- Physical Performance Testing, 97110-Therapeutic exercises, 97530- Therapeutic activity, [id number]- Neuromuscular re-education, 97535- Self Care, [id number]- Manual therapy, [id number]- Gait training, [phone]- Electrical stimulation (unattended), 20560 (1-2 muscles),  20561 (3+ muscles)- Dry Needling, Patient/Family education, Balance training, Stair training, Taping, Joint mobilization, Spinal mobilization, Cryotherapy, and Moist heat.  PLAN FOR NEXT SESSION: *Have patient rate PSFS. Continue L LE strengthening. Core strengthening.    Redell SAUNDERS  Maranda, PT, DPT 11/23/2024, 4:53 PM  "

## 2024-11-28 ENCOUNTER — Ambulatory Visit

## 2024-11-30 ENCOUNTER — Ambulatory Visit
